# Patient Record
Sex: Male | Born: 1955 | Race: Black or African American | Hispanic: No | Marital: Single | Smoking: Former smoker
Health system: Southern US, Community
[De-identification: ages and names within clinical notes are randomized; demographics above are authoritative.]

## PROBLEM LIST (undated history)

## (undated) DIAGNOSIS — K219 Gastro-esophageal reflux disease without esophagitis: Secondary | ICD-10-CM

## (undated) DIAGNOSIS — N390 Urinary tract infection, site not specified: Secondary | ICD-10-CM

## (undated) DIAGNOSIS — R399 Unspecified symptoms and signs involving the genitourinary system: Secondary | ICD-10-CM

## (undated) DIAGNOSIS — S42401A Unspecified fracture of lower end of right humerus, initial encounter for closed fracture: Secondary | ICD-10-CM

## (undated) DIAGNOSIS — R7303 Prediabetes: Secondary | ICD-10-CM

## (undated) DIAGNOSIS — L309 Dermatitis, unspecified: Secondary | ICD-10-CM

## (undated) DIAGNOSIS — I10 Essential (primary) hypertension: Secondary | ICD-10-CM

## (undated) HISTORY — DX: Gastro-esophageal reflux disease without esophagitis: K21.9

## (undated) HISTORY — PX: POLYPECTOMY: SHX149

## (undated) HISTORY — DX: Unspecified symptoms and signs involving the genitourinary system: R39.9

## (undated) HISTORY — DX: Prediabetes: R73.03

## (undated) HISTORY — PX: KNEE ARTHROSCOPY W/ MENISCAL REPAIR: SHX1877

## (undated) HISTORY — DX: Dermatitis, unspecified: L30.9

## (undated) HISTORY — DX: Essential (primary) hypertension: I10

## (undated) HISTORY — DX: Urinary tract infection, site not specified: N39.0

## (undated) HISTORY — DX: Unspecified fracture of lower end of right humerus, initial encounter for closed fracture: S42.401A

---

## 2006-11-30 ENCOUNTER — Emergency Department (HOSPITAL_COMMUNITY): Admission: EM | Admit: 2006-11-30 | Discharge: 2006-11-30 | Payer: Self-pay | Admitting: Emergency Medicine

## 2007-01-15 ENCOUNTER — Ambulatory Visit: Payer: Self-pay | Admitting: Internal Medicine

## 2007-01-15 LAB — CONVERTED CEMR LAB
BUN: 11 mg/dL (ref 6–23)
Calcium: 9 mg/dL (ref 8.4–10.5)
Chloride: 104 meq/L (ref 96–112)
Creatinine, Ser: 1.5 mg/dL (ref 0.4–1.5)
Eosinophils Absolute: 0.4 10*3/uL (ref 0.0–0.6)
GFR calc Af Amer: 64 mL/min
GFR calc non Af Amer: 53 mL/min
Glucose, Bld: 84 mg/dL (ref 70–99)
Hemoglobin: 15.6 g/dL (ref 13.0–17.0)
Lymphocytes Relative: 43.7 % (ref 12.0–46.0)
MCHC: 34.2 g/dL (ref 30.0–36.0)
MCV: 88.7 fL (ref 78.0–100.0)
Neutrophils Relative %: 38.7 % — ABNORMAL LOW (ref 43.0–77.0)
PSA: 0.63 ng/mL (ref 0.10–4.00)
Potassium: 4 meq/L (ref 3.5–5.1)
RDW: 12.1 % (ref 11.5–14.6)
Sodium: 140 meq/L (ref 135–145)
WBC: 4.8 10*3/uL (ref 4.5–10.5)

## 2007-01-17 ENCOUNTER — Encounter: Payer: Self-pay | Admitting: Internal Medicine

## 2007-01-17 LAB — CONVERTED CEMR LAB
Bilirubin Urine: NEGATIVE
Ketones, ur: NEGATIVE mg/dL
Leukocytes, UA: NEGATIVE
RBC / HPF: NONE SEEN (ref <3)
Urine Glucose: NEGATIVE mg/dL
Urobilinogen, UA: 0.2 (ref 0.0–1.0)
WBC, UA: NONE SEEN cells/hpf (ref <3)

## 2007-03-21 ENCOUNTER — Ambulatory Visit: Payer: Self-pay | Admitting: Cardiology

## 2007-04-11 ENCOUNTER — Encounter: Payer: Self-pay | Admitting: Internal Medicine

## 2007-04-11 ENCOUNTER — Ambulatory Visit: Payer: Self-pay | Admitting: Internal Medicine

## 2007-04-11 LAB — CONVERTED CEMR LAB
Bilirubin Urine: NEGATIVE
Bilirubin Urine: NEGATIVE
Glucose, Urine, Semiquant: NEGATIVE
Ketones, urine, test strip: NEGATIVE
Nitrite: NEGATIVE
Protein, U semiquant: NEGATIVE
Protein, ur: NEGATIVE mg/dL
Specific Gravity, Urine: 1.015
Specific Gravity, Urine: 1.017 (ref 1.005–1.03)
Urine Glucose: NEGATIVE mg/dL
Urobilinogen, UA: NEGATIVE
WBC Urine, dipstick: NEGATIVE
WBC, UA: NONE SEEN cells/hpf (ref <3)
pH: 6 (ref 5.0–8.0)

## 2007-08-05 ENCOUNTER — Telehealth (INDEPENDENT_AMBULATORY_CARE_PROVIDER_SITE_OTHER): Payer: Self-pay | Admitting: *Deleted

## 2007-08-05 ENCOUNTER — Encounter (INDEPENDENT_AMBULATORY_CARE_PROVIDER_SITE_OTHER): Payer: Self-pay | Admitting: *Deleted

## 2007-12-31 ENCOUNTER — Encounter: Payer: Self-pay | Admitting: Internal Medicine

## 2008-12-24 ENCOUNTER — Ambulatory Visit: Payer: Self-pay | Admitting: Internal Medicine

## 2008-12-24 ENCOUNTER — Encounter (INDEPENDENT_AMBULATORY_CARE_PROVIDER_SITE_OTHER): Payer: Self-pay | Admitting: *Deleted

## 2008-12-27 LAB — CONVERTED CEMR LAB
ALT: 60 units/L — ABNORMAL HIGH (ref 0–53)
AST: 24 units/L (ref 0–37)
Cholesterol: 178 mg/dL (ref 0–200)
Eosinophils Absolute: 0.3 10*3/uL (ref 0.0–0.7)
Eosinophils Relative: 6 % — ABNORMAL HIGH (ref 0–5)
LDL Cholesterol: 99 mg/dL (ref 0–99)
Lymphs Abs: 2.4 10*3/uL (ref 0.7–4.0)
MCHC: 33.6 g/dL (ref 30.0–36.0)
Monocytes Absolute: 0.4 10*3/uL (ref 0.1–1.0)
Monocytes Relative: 7 % (ref 3–12)
Neutro Abs: 2.2 10*3/uL (ref 1.7–7.7)
Neutrophils Relative %: 41 % — ABNORMAL LOW (ref 43–77)
PSA: 0.67 ng/mL (ref 0.10–4.00)
Platelets: 287 10*3/uL (ref 150–400)
Potassium: 4.2 meq/L (ref 3.5–5.3)
Sodium: 140 meq/L (ref 135–145)
TSH: 0.953 microintl units/mL (ref 0.350–4.50)
Triglycerides: 95 mg/dL (ref <150)
WBC: 5.4 10*3/uL (ref 4.0–10.5)

## 2008-12-28 ENCOUNTER — Ambulatory Visit: Payer: Self-pay | Admitting: Internal Medicine

## 2009-01-03 ENCOUNTER — Encounter (INDEPENDENT_AMBULATORY_CARE_PROVIDER_SITE_OTHER): Payer: Self-pay | Admitting: *Deleted

## 2009-03-15 ENCOUNTER — Ambulatory Visit: Payer: Self-pay | Admitting: Internal Medicine

## 2009-03-15 ENCOUNTER — Encounter (INDEPENDENT_AMBULATORY_CARE_PROVIDER_SITE_OTHER): Payer: Self-pay | Admitting: *Deleted

## 2009-03-15 DIAGNOSIS — I10 Essential (primary) hypertension: Secondary | ICD-10-CM | POA: Insufficient documentation

## 2009-03-15 HISTORY — DX: Essential (primary) hypertension: I10

## 2009-03-29 ENCOUNTER — Ambulatory Visit: Payer: Self-pay | Admitting: Internal Medicine

## 2009-03-30 ENCOUNTER — Telehealth: Payer: Self-pay | Admitting: Internal Medicine

## 2009-04-22 ENCOUNTER — Encounter: Payer: Self-pay | Admitting: Internal Medicine

## 2009-06-06 ENCOUNTER — Ambulatory Visit: Payer: Self-pay | Admitting: Internal Medicine

## 2009-10-05 ENCOUNTER — Encounter (INDEPENDENT_AMBULATORY_CARE_PROVIDER_SITE_OTHER): Payer: Self-pay | Admitting: *Deleted

## 2010-01-07 ENCOUNTER — Emergency Department (HOSPITAL_COMMUNITY): Admission: EM | Admit: 2010-01-07 | Discharge: 2010-01-07 | Payer: Self-pay | Admitting: Family Medicine

## 2010-01-09 ENCOUNTER — Telehealth: Payer: Self-pay | Admitting: Internal Medicine

## 2010-02-10 ENCOUNTER — Ambulatory Visit: Payer: Self-pay | Admitting: Internal Medicine

## 2010-02-13 LAB — CONVERTED CEMR LAB
ALT: 44 units/L (ref 0–53)
AST: 23 units/L (ref 0–37)
Albumin: 4.2 g/dL (ref 3.5–5.2)
Alkaline Phosphatase: 49 units/L (ref 39–117)
BUN: 9 mg/dL (ref 6–23)
Basophils Absolute: 0 10*3/uL (ref 0.0–0.1)
Basophils Relative: 0.5 % (ref 0.0–3.0)
Bilirubin, Direct: 0.1 mg/dL (ref 0.0–0.3)
CO2: 30 meq/L (ref 19–32)
Calcium: 9.5 mg/dL (ref 8.4–10.5)
Chloride: 98 meq/L (ref 96–112)
Cholesterol: 186 mg/dL (ref 0–200)
Creatinine, Ser: 1.4 mg/dL (ref 0.4–1.5)
Eosinophils Absolute: 0.3 10*3/uL (ref 0.0–0.7)
Eosinophils Relative: 7 % — ABNORMAL HIGH (ref 0.0–5.0)
GFR calc non Af Amer: 68.19 mL/min (ref >60)
Glucose, Bld: 92 mg/dL (ref 70–99)
HCT: 46.8 % (ref 39.0–52.0)
HDL: 71.2 mg/dL (ref >39.00)
Hemoglobin: 16 g/dL (ref 13.0–17.0)
LDL Cholesterol: 104 mg/dL — ABNORMAL HIGH (ref 0–99)
Lymphocytes Relative: 39.3 % (ref 12.0–46.0)
Lymphs Abs: 1.9 10*3/uL (ref 0.7–4.0)
MCHC: 34.3 g/dL (ref 30.0–36.0)
MCV: 86.7 fL (ref 78.0–100.0)
Monocytes Absolute: 0.4 10*3/uL (ref 0.1–1.0)
Monocytes Relative: 8.6 % (ref 3.0–12.0)
Neutro Abs: 2.2 10*3/uL (ref 1.4–7.7)
Neutrophils Relative %: 44.6 % (ref 43.0–77.0)
PSA: 0.94 ng/mL (ref 0.10–4.00)
Platelets: 303 10*3/uL (ref 150.0–400.0)
Potassium: 4 meq/L (ref 3.5–5.1)
RBC: 5.39 M/uL (ref 4.22–5.81)
RDW: 13.8 % (ref 11.5–14.6)
Sodium: 133 meq/L — ABNORMAL LOW (ref 135–145)
TSH: 0.6 microintl units/mL (ref 0.35–5.50)
Total Bilirubin: 0.6 mg/dL (ref 0.3–1.2)
Total CHOL/HDL Ratio: 3
Total Protein: 7.6 g/dL (ref 6.0–8.3)
Triglycerides: 56 mg/dL (ref 0.0–149.0)
VLDL: 11.2 mg/dL (ref 0.0–40.0)
WBC: 4.9 10*3/uL (ref 4.5–10.5)

## 2010-02-14 ENCOUNTER — Encounter (INDEPENDENT_AMBULATORY_CARE_PROVIDER_SITE_OTHER): Payer: Self-pay | Admitting: *Deleted

## 2010-02-14 ENCOUNTER — Encounter: Payer: Self-pay | Admitting: Internal Medicine

## 2010-03-13 ENCOUNTER — Ambulatory Visit: Payer: Self-pay | Admitting: Internal Medicine

## 2010-03-14 ENCOUNTER — Encounter (INDEPENDENT_AMBULATORY_CARE_PROVIDER_SITE_OTHER): Payer: Self-pay | Admitting: *Deleted

## 2010-10-09 ENCOUNTER — Ambulatory Visit: Payer: Self-pay | Admitting: Internal Medicine

## 2010-10-09 DIAGNOSIS — L42 Pityriasis rosea: Secondary | ICD-10-CM

## 2010-12-03 ENCOUNTER — Encounter: Payer: Self-pay | Admitting: Internal Medicine

## 2010-12-04 ENCOUNTER — Encounter: Payer: Self-pay | Admitting: Internal Medicine

## 2010-12-12 NOTE — Assessment & Plan Note (Signed)
Summary: CPX/NS/KDC AND FASTING LABS///SPH   Vital Signs:  Patient profile:   55 year old male Height:      68.75 inches Weight:      168.25 pounds BMI:     25.12 Pulse rate:   82 / minute BP sitting:   110 / 72  Vitals Entered By: Kandice Hams (February 10, 2010 12:40 PM) CC: cpx   History of Present Illness: CPX feels well  Allergies: No Known Drug Allergies  Past History:  Past Medical History: UTI 2008, saw urology; patient reports a cystoscopy (told ok) R elbom Fx, no surgery 2-11  Past Surgical History: rt knee torn meniscus  Family History: breast Ca - M HTN - M DM - M CAD -- no stroke - brother colon Ca - no prostate Ca - uncle  Social History: Married - wife is June Groeneveld 1 child  from previous marriage  works @ [employer]-- ok tobacco-- quit 2008 ETOH-- no exercise-- active, also exercise routinely   Review of Systems General:  Denies fatigue, fever, and weight loss. CV:  Denies chest pain or discomfort and swelling of feet. Resp:  Denies cough and shortness of breath. GI:  Denies bloody stools, nausea, and vomiting. GU:  Denies dysuria, hematuria, and urinary hesitancy. Derm:  rash betwen buttocks , was seen by derm before , was Rx a cream that helped  "same type of rash", ++ pruritus  also rash in the hand x few days , ++ pruritus.  Physical Exam  General:  alert, well-developed, and well-nourished.   Neck:  no masses, no thyromegaly, and no cervical lymphadenopathy.   Lungs:  normal respiratory effort, no intercostal retractions, no accessory muscle use, and normal breath sounds.   Heart:  normal rate, regular rhythm, no murmur, and no gallop.   Abdomen:  soft, non-tender, no distention, no masses, no guarding, and no rigidity.   Rectal:  No external abnormalities noted. Normal sphincter tone. No rectal masses or tenderness. brown stools, Hemoccult negative Prostate:  Prostate gland firm and smooth, no enlargement,  nodularity, tenderness, mass, asymmetry or induration. Skin:  between the buttocks   has a area slightly macerated skin, hypochromic.  On palpation, there is no mass or induration  right hand, between the first and second finger has a 1 cm area off rough, hyperchromic skin   Impression & Recommendations:  Problem # 1:  HEALTH SCREENING (ICD-V70.0)  Td 2008  never had a cscope  Colonoscopy Vs.iFOB cards reviewed w/ pt. Provided  iFOB but he will  call if he decides to have a  colonoscopy  praised  for his healthy lifestyle labs    Orders: Venipuncture (59563) TLB-BMP (Basic Metabolic Panel-BMET) (80048-METABOL) TLB-Hepatic/Liver Function Pnl (80076-HEPATIC) TLB-Lipid Panel (80061-LIPID) TLB-PSA (Prostate Specific Antigen) (84153-PSA) TLB-TSH (Thyroid Stimulating Hormone) (84443-TSH) TLB-CBC Platelet - w/Differential (85025-CBCD)  Problem # 2:  DERMATITIS (ICD-692.9)  dermatitis between the buttocks: Intertrigo?   hand dermatitis: Fungal?  see instructions   His updated medication list for this problem includes:    Clobetasol Propionate 0.05 % Oint (Clobetasol propionate) .Marland Kitchen... Apply twice a day  Complete Medication List: 1)  Amlodipine Besylate 5 Mg Tabs (Amlodipine besylate) .Marland Kitchen.. 1 a day 2)  Clobetasol Propionate 0.05 % Oint (Clobetasol propionate) .... Apply twice a day 3)  Ketoconazole 2 % Crea (Ketoconazole) .... Apply twice a day  Patient Instructions: 1)  mix  clobetasol and ketoconazole 50/50 and applied to the hand and buttocks twice a day  for 10 days.  If not better let me know 2)  Please schedule a follow-up appointment in 1 year.  Prescriptions: AMLODIPINE BESYLATE 5 MG TABS (AMLODIPINE BESYLATE) 1 a day  #90 x 4   Entered and Authorized by:   Elita Quick E. Lovelace Cerveny MD   Signed by:   Nolon Rod. Pearlie Nies MD on 02/10/2010   Method used:   Print then Give to Patient   RxID:   [id number] KETOCONAZOLE 2 % CREA (KETOCONAZOLE) apply twice a day  #1 x 0   Entered and Authorized  by:   Nolon Rod. Jaslyne Beeck MD   Signed by:   Nolon Rod. Kyrian Stage MD on 02/10/2010   Method used:   Print then Give to Patient   RxID:   [id number] CLOBETASOL PROPIONATE 0.05 % OINT (CLOBETASOL PROPIONATE) apply twice a day  #1 x 0   Entered and Authorized by:   Nolon Rod. Pastor Sgro MD   Signed by:   Nolon Rod. Tierra Thoma MD on 02/10/2010   Method used:   Print then Give to Patient   RxID:   [id number]

## 2010-12-12 NOTE — Letter (Signed)
Summary: Generic Letter  [facility] [place]  [address] [place], [place] [address]   Phone: [phone]  Fax: [phone]    03/14/2010  THOMAS MABRY [address] [place], [place]  [address]  Dear Mr. LATORRE,   Please return the stool kit that was provided to you at your last office visit.  You can return it in the evelope that was provided with the kit.  Please call our office with any concerns.   Sincerely,   Shary Decamp

## 2010-12-12 NOTE — Assessment & Plan Note (Signed)
Summary: rash all over/kn   Vital Signs:  Patient profile:   55 year old male Weight:      167.13 pounds Temp:     97.9 degrees F oral Pulse rate:   86 / minute Pulse rhythm:   regular BP sitting:   132 / 76  (left arm) Cuff size:   large  Vitals Entered By: Army Fossa CMA (October 09, 2010 11:54 AM) CC: Pt here c/o rash on chest, stomach, and back. Comments States it itches x 1 week  [facility] [address]   History of Present Illness: rash for 10 days In the chest, abdomen and back. Very mild in the legs. Not affecting the face. causing some itching some lesions started as blisters?  Review of systems No fever or arthralgias. In general feels very well except for the rash  Current Medications (verified): 1)  Amlodipine Besylate 5 Mg Tabs (Amlodipine Besylate) .Marland Kitchen.. 1 A Day 2)  Clobetasol Propionate 0.05 % Oint (Clobetasol Propionate) .... Apply Twice A Day 3)  Ketoconazole 2 % Crea (Ketoconazole) .... Apply Twice A Day  Allergies (verified): No Known Drug Allergies  Past History:  Past Medical History: Reviewed history from 02/10/2010 and no changes required. UTI 2008, saw urology; patient reports a cystoscopy (told ok) R elbom Fx, no surgery 2-11  Past Surgical History: Reviewed history from 02/10/2010 and no changes required. rt knee torn meniscus  Physical Exam  General:  alert, well-developed, and well-nourished.   Skin:  multiple macular lesions (abdomen>back=chest>> leg), largest lesion in  size no more than 1 cm, skin is  slightly scaly and hyperchromic, borders slightly elevated. Several lesions in the back in a classic Christmas tree distribution. 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 despite the fact that some of the lesions started as blisters (no blisters seen today, lesions are quite consistent w/ P.R.) extensive discussion about the dx showed him  pictures likes "something done" per UTD some experts recommend acyclovir. Rx provided see instructions   Complete Medication List: 1)  Amlodipine Besylate 5 Mg Tabs (Amlodipine besylate) .Marland Kitchen.. 1 a day 2)  Clobetasol Propionate 0.05 % Oint (Clobetasol propionate) .... Apply twice a day 3)  Ketoconazole 2 % Crea (Ketoconazole) .... Apply twice a day 4)  Acyclovir 400 Mg Tabs (Acyclovir) .Marland Kitchen.. 1 by mouth 5 times a day x 1 week  Patient Instructions: 1)  benadryl at night 2)  rash is : pytiriasis rosea  Prescriptions: ACYCLOVIR 400 MG TABS (ACYCLOVIR) 1 by mouth 5 times a day x 1 week  #35 x 0   Entered and Authorized by:   Nolon Rod. Paz MD   Signed by:   Nolon Rod. Paz MD on 10/09/2010   Method used:   Print then Give to Patient   RxID:   [id number]    Orders Added: 1)  Est. Patient Level III [56213]

## 2010-12-12 NOTE — Letter (Signed)
Summary: [facility]: Immunoassay Fecal Occult Blood (iFOB) Order Form  [facility] [place]  [address] [place], [place] [address]   Phone: [phone]  Fax: [phone]      [facility]: Immunoassay Fecal Occult Blood (iFOB) Order Form   February 14, 2010 MRN: [id number]   Steven Wood 1956/03/21   Physicican Name:_________paz________________  Diagnosis Code:__________v76.51________________      Steven Wood

## 2010-12-12 NOTE — Letter (Signed)
Summary: Annual Physical Form/[facility]  Annual Physical Form/[facility]   Imported By: Lanelle Bal 02/17/2010 11:59:41  _____________________________________________________________________  External Attachment:    Type:   Image     Comment:   External Document

## 2010-12-12 NOTE — Progress Notes (Signed)
Summary: FYI  Phone Note From Other Clinic    Follow-up for Phone Call        Reason for Call: Wife/June callinng that pt injured R arm 12/31/09 and now has continued swelling from elbow down to hand. Pt fell on arm. triaged elbow injury and inst to go to the E/R for eval. Protocol(s) Used: Elbow Injury Recommended Outcome per Protocol: See ED Immediately Reason for Outcome: Unbearable pain Care Advice:  ~ Another adult should drive.  ~ Do not give the patient anything to eat or drink.  ~ Remove any rings on the fingers of the affected hand, if possible.  ~ Apply cloth-covered ice pack or a cool compress to the area while in transit to reduce pain and swelling.  ~ Support injured part in position of comfort to reduce pain and prevent further damage; avoid unnecessary movement. 02/  Additional Follow-up for Phone Call Additional follow up Details #1::        left msg for pt to call .Kandice Hams  January 09, 2010 8:39 AM --Pt called back [facility]  made appt for  Ortho Surgeon friday 01/13/10 at 10AM for assesment. --Pt has appt for his cpx 02/10/10  Additional Follow-up by: Kandice Hams,  January 09, 2010 10:18 AM

## 2010-12-25 IMAGING — CR DG ELBOW COMPLETE 3+V*R*
4 series · 4 of 4 positions shown · non-contrast
Comparison: None.

CLINICAL DATA: Elbow injury.  Pain and swelling.

RIGHT ELBOW - COMPLETE 3+ VIEW

[view not recorded (1 of 4)]
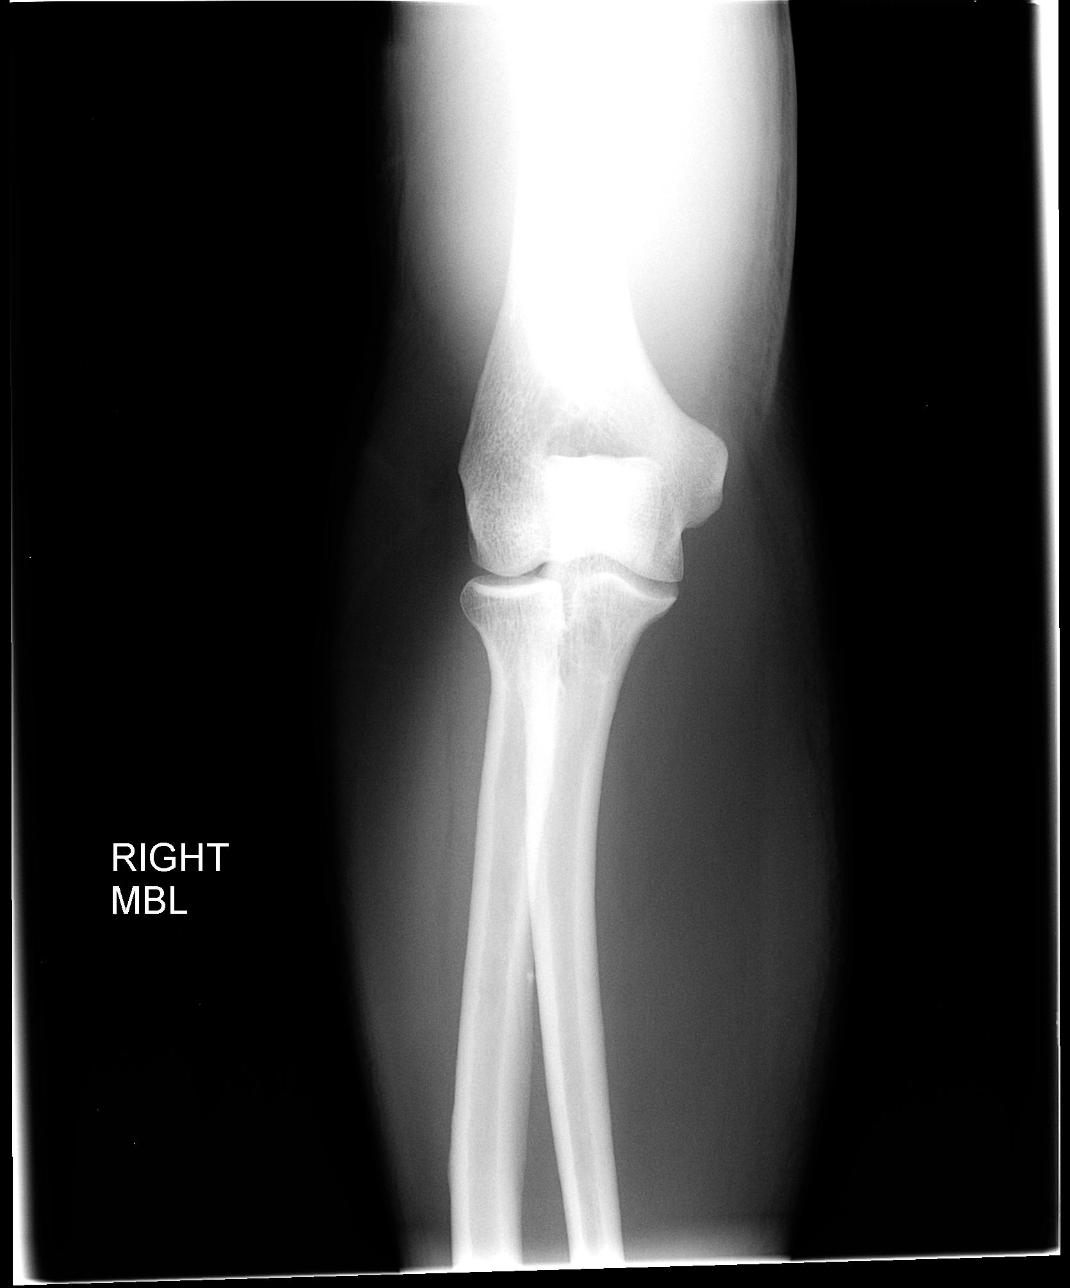

[view not recorded (2 of 4)]
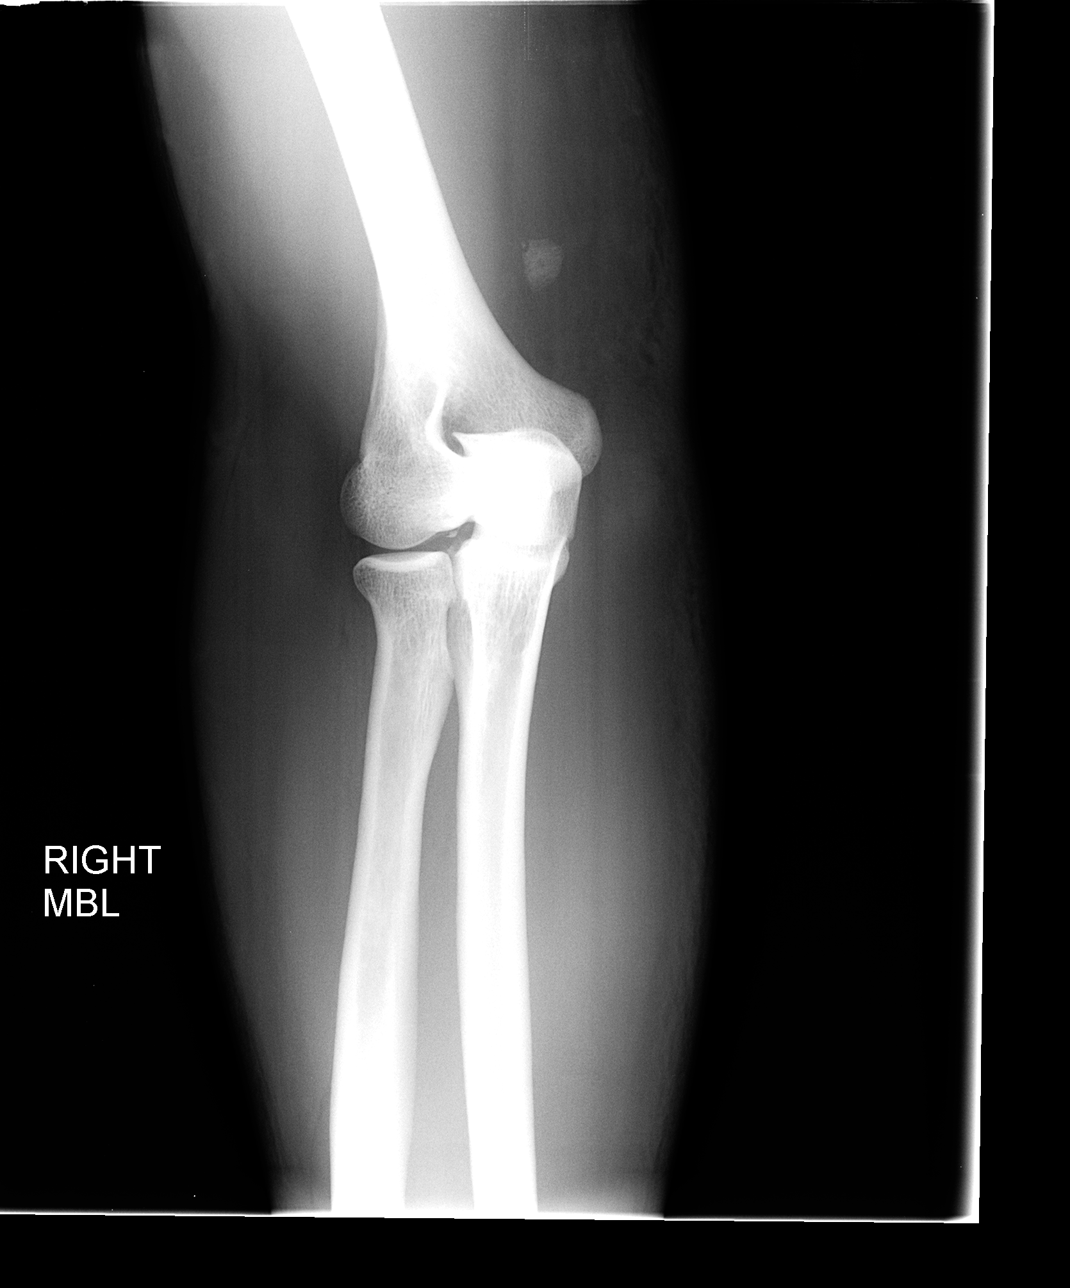

[view not recorded (3 of 4)]
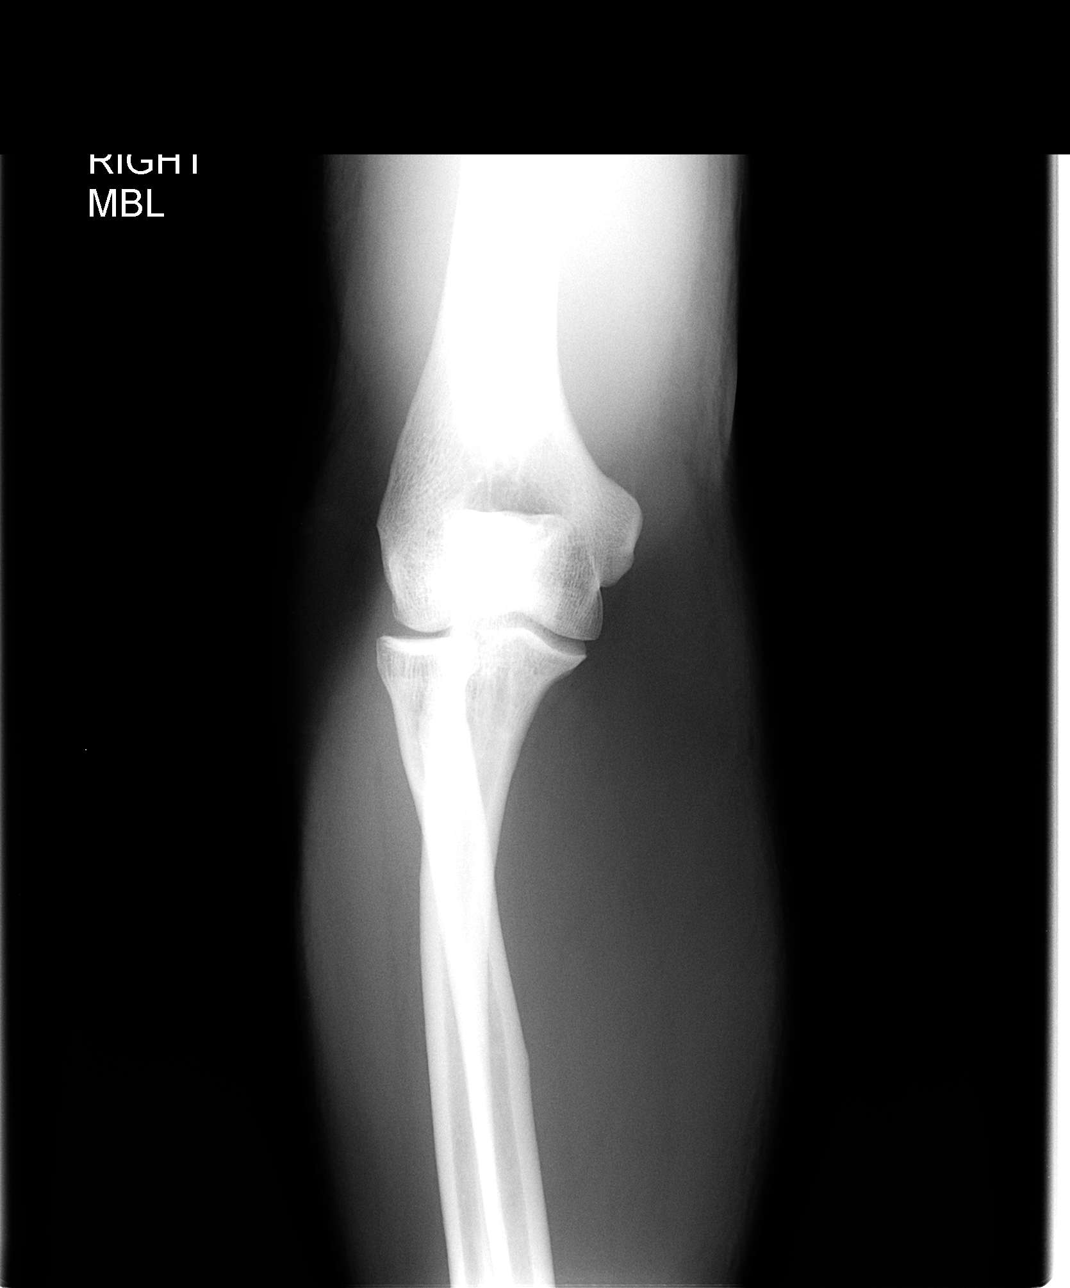

[view not recorded (4 of 4)]
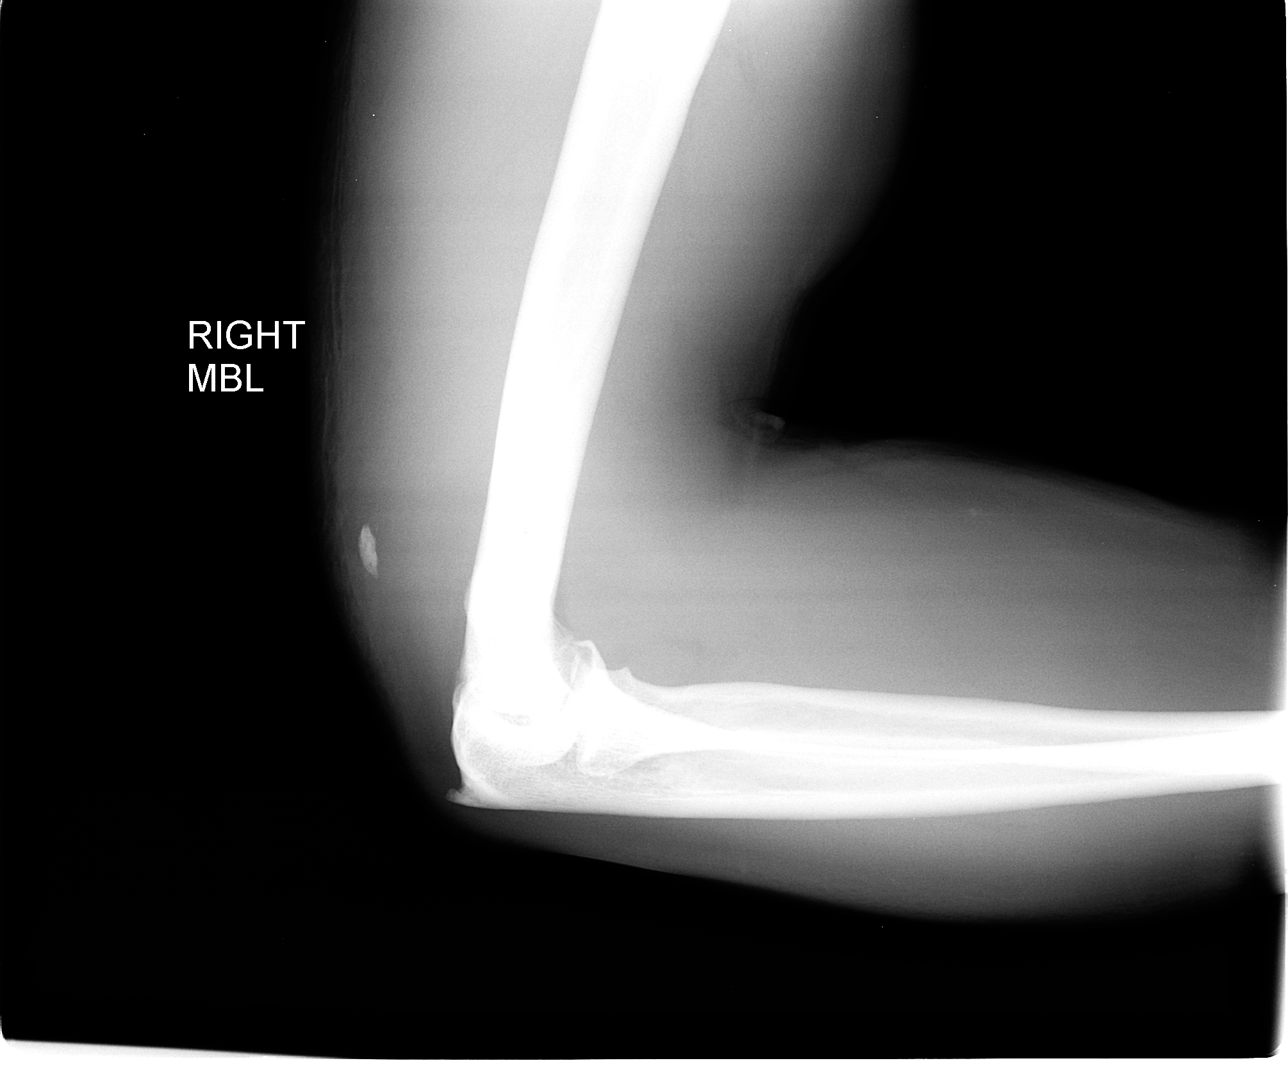

[4 of 4 positions shown; findings below may reference images not displayed]

FINDINGS: No evidence of acute fracture or dislocation.  No
evidence of elbow joint effusion.

Mild degenerative spurring of the elbow joint is seen.  Small
olecranon bone spur noted.  Heterotopic ossification is seen in the
dorsal soft tissues adjacent to the distal humerus.
IMPRESSION: No acute findings.

## 2010-12-25 IMAGING — CR DG HUMERUS 2V *R*
3 series · 3 of 3 positions shown · non-contrast
Comparison: None.

CLINICAL DATA: Fall.  Arm injury and pain.

RIGHT HUMERUS - 2+ VIEW

[view not recorded (1 of 3)]
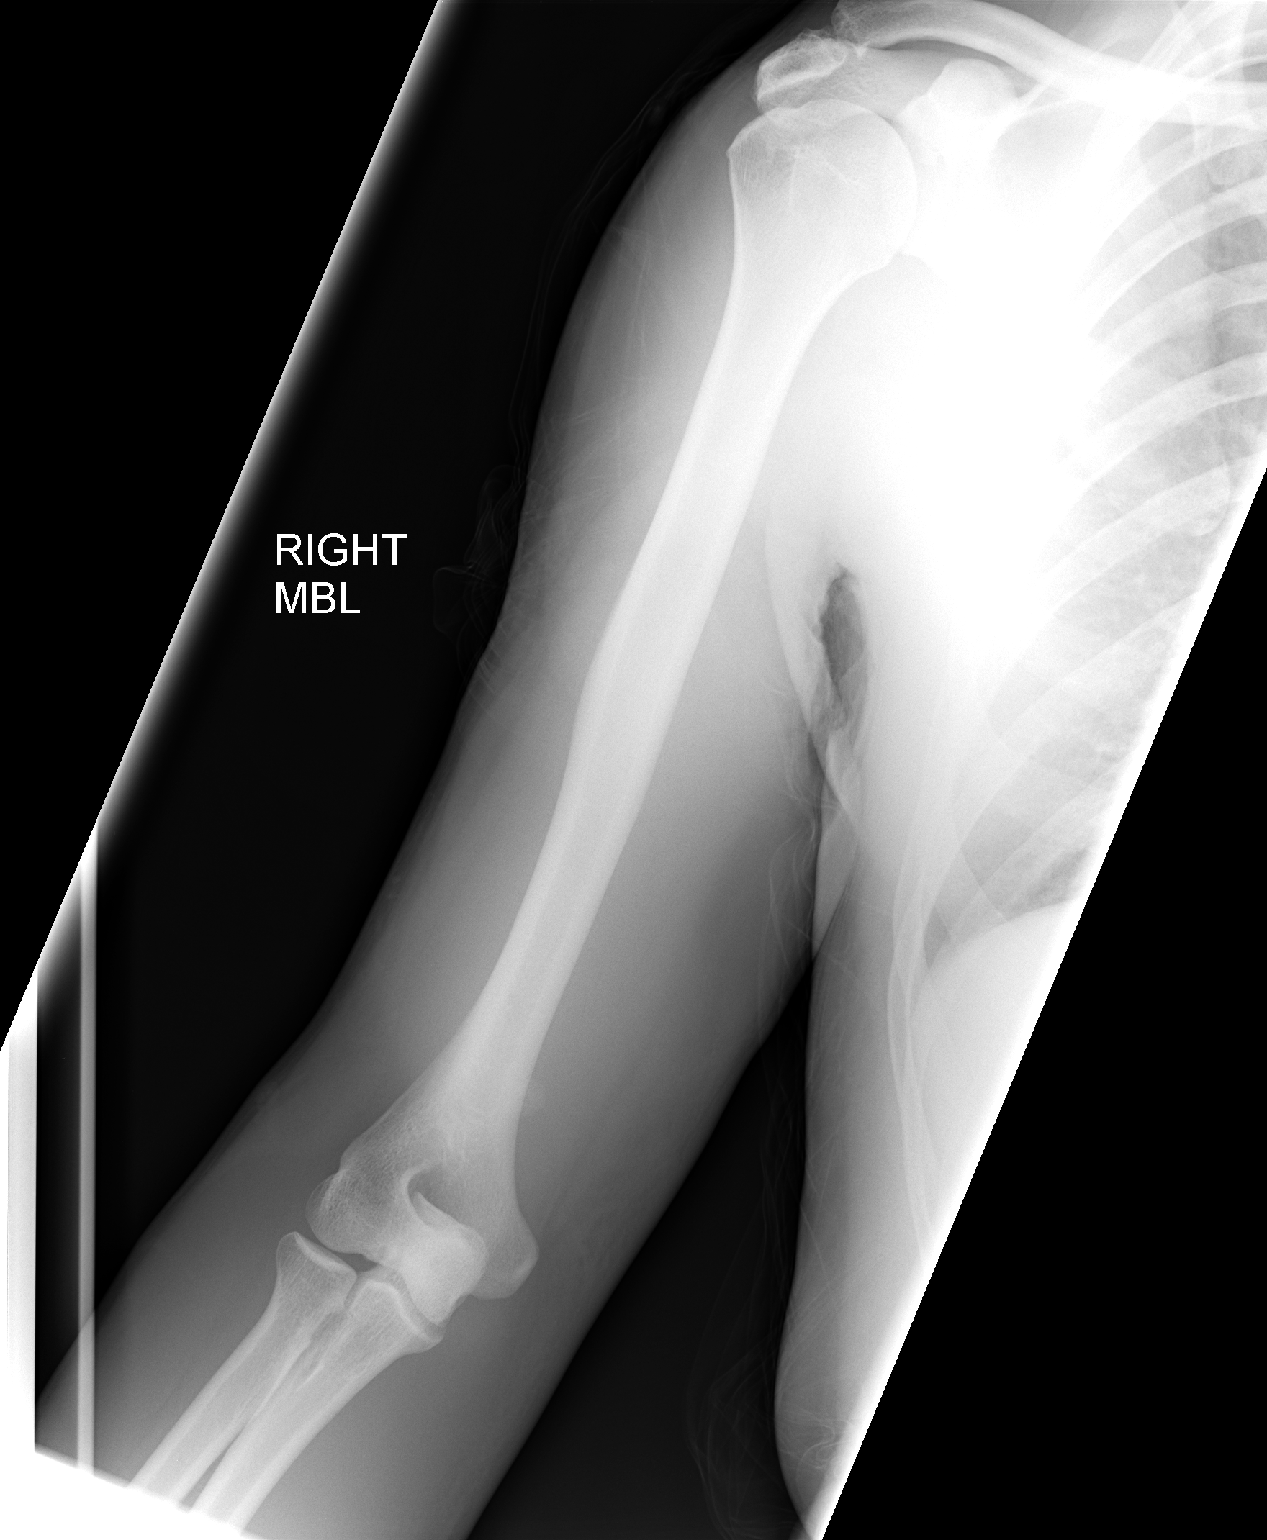

[view not recorded (2 of 3)]
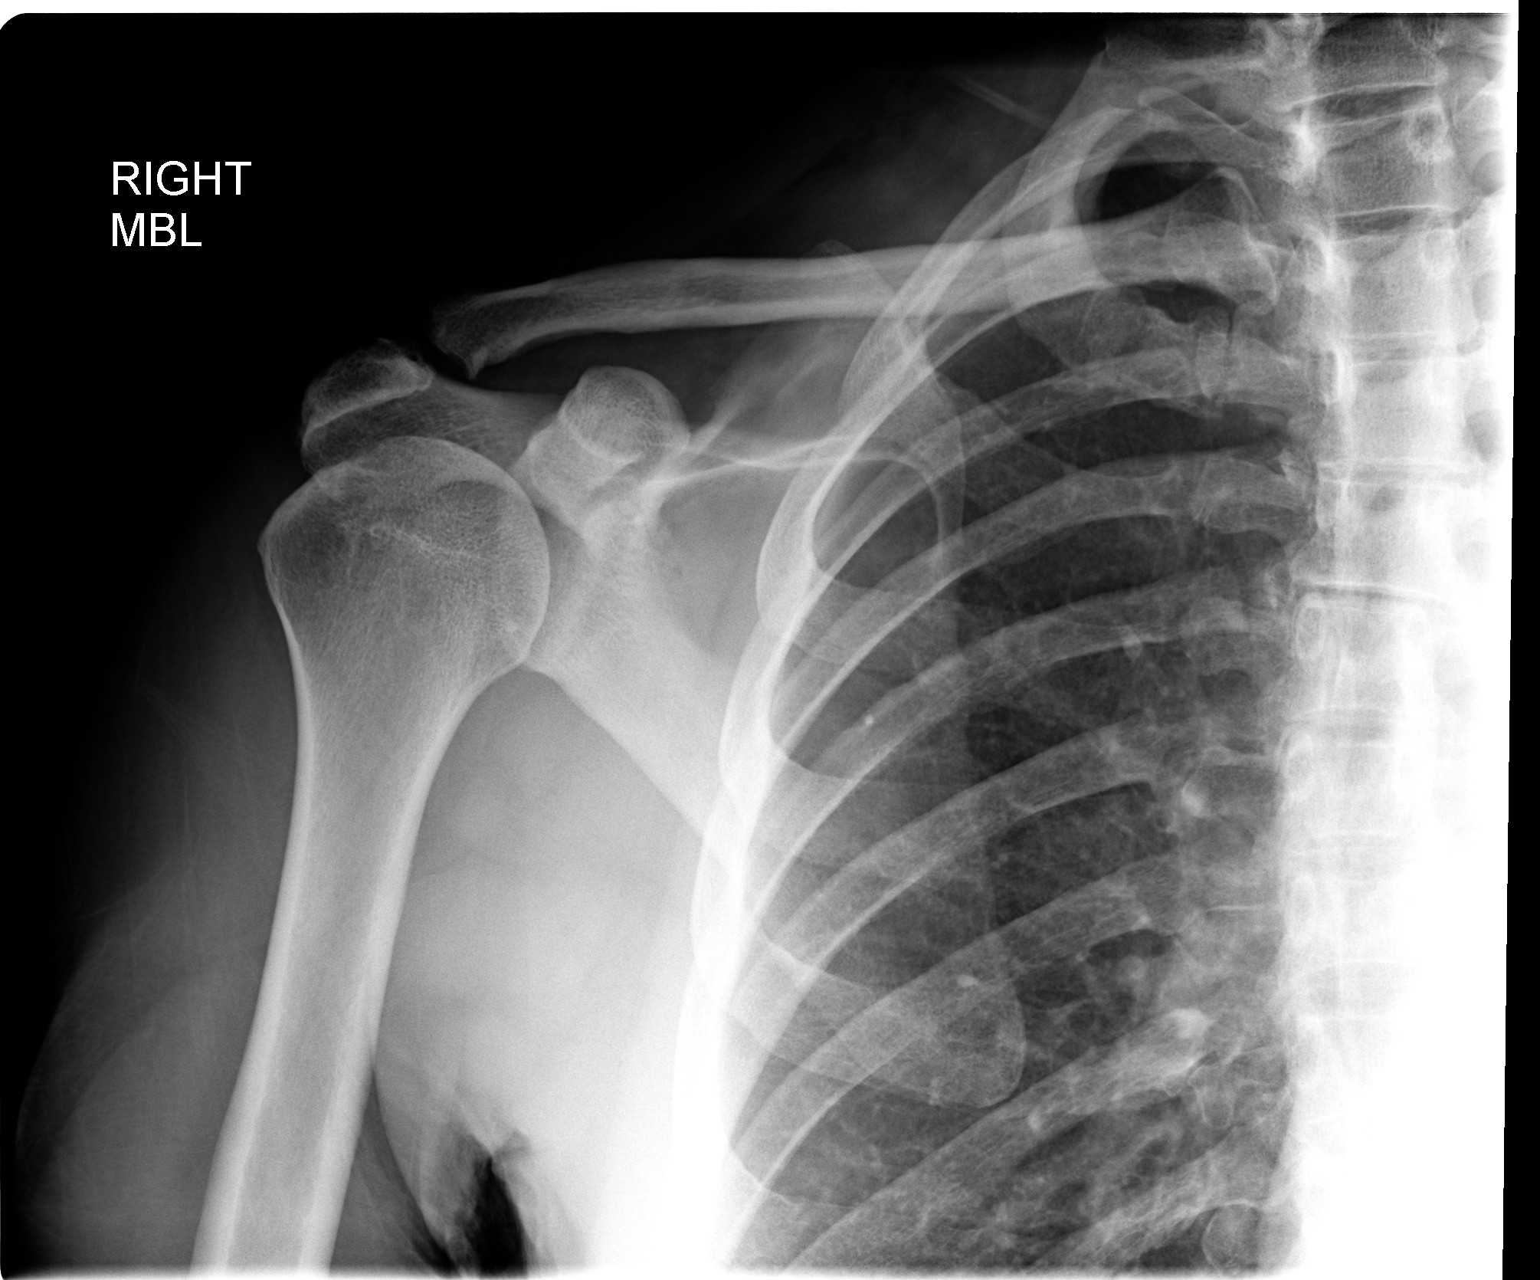

[view not recorded (3 of 3)]
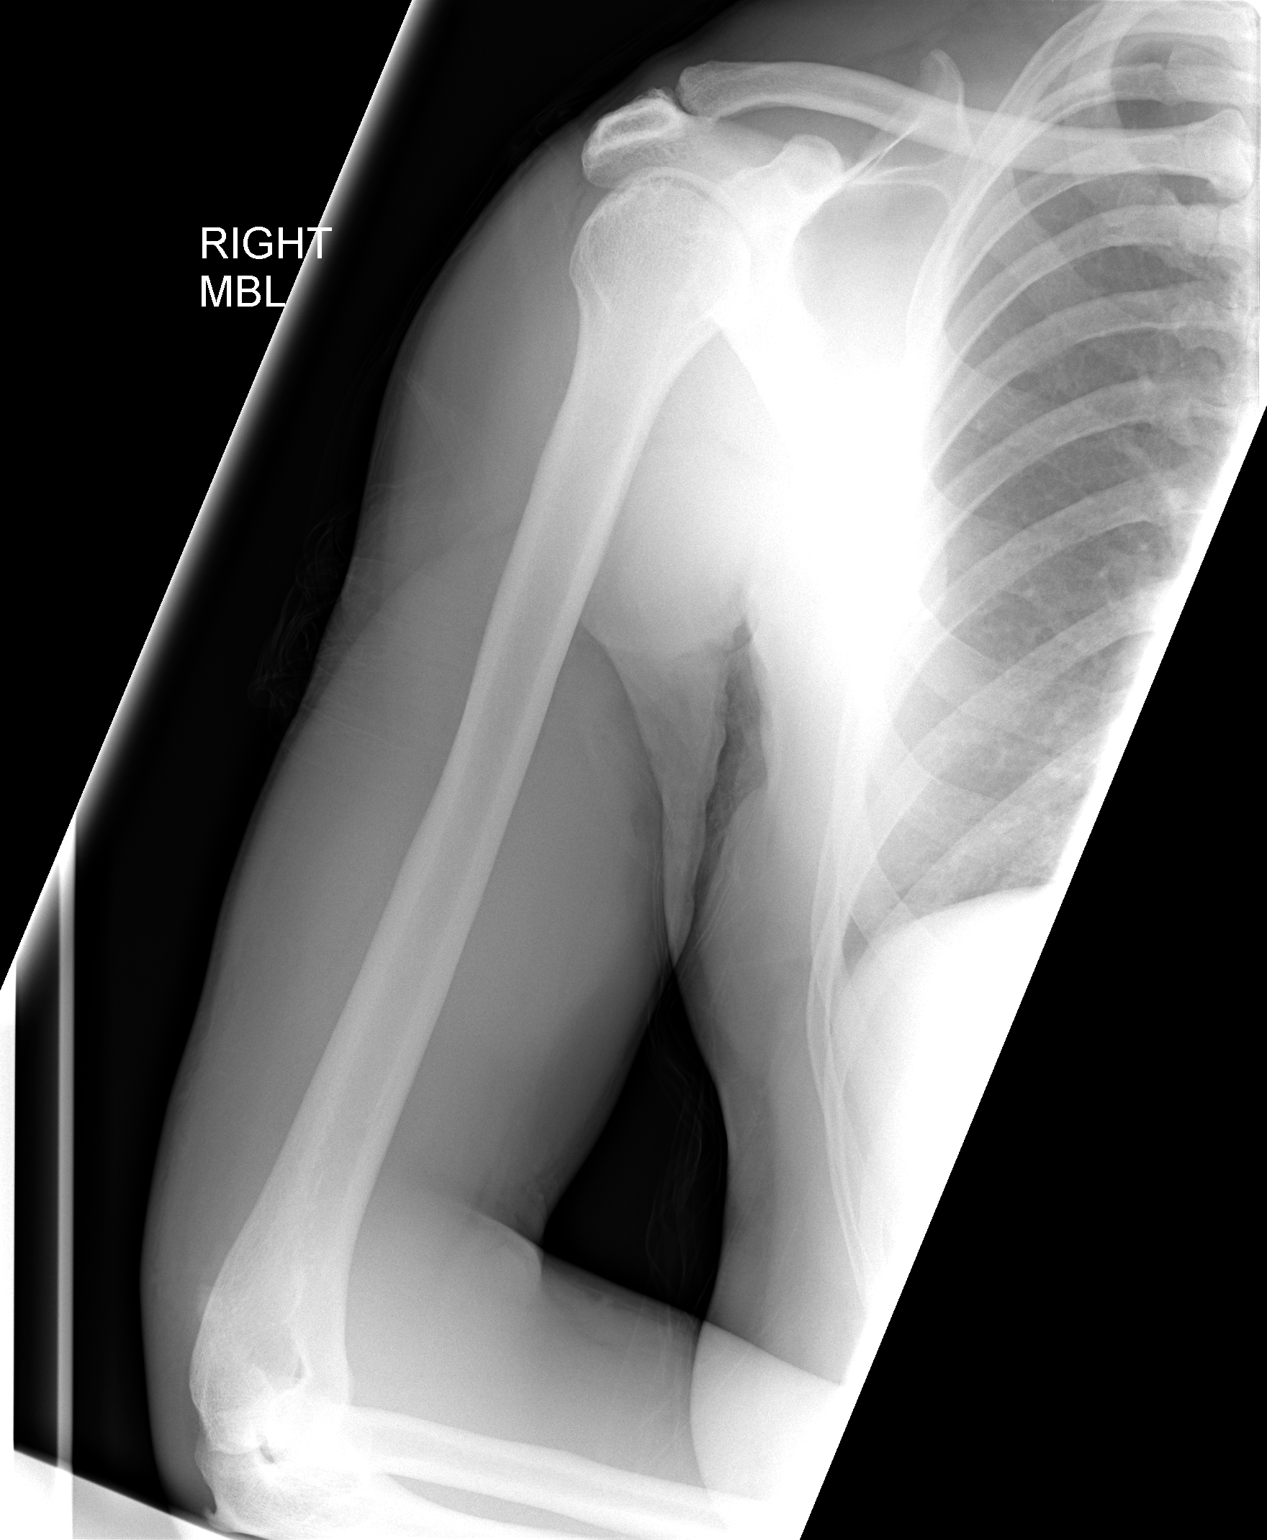

[3 of 3 positions shown; findings below may reference images not displayed]

FINDINGS: No evidence of fracture or other significant bone
abnormality.  Degenerative spurring is seen involving the
acromioclavicular joint.  Lateral downsloping of the acromion also
noted, which predisposes to shoulder impingement.
IMPRESSION: No acute findings.

## 2011-02-23 ENCOUNTER — Other Ambulatory Visit: Payer: Self-pay | Admitting: Internal Medicine

## 2011-06-02 ENCOUNTER — Other Ambulatory Visit: Payer: Self-pay | Admitting: Internal Medicine

## 2011-06-04 ENCOUNTER — Telehealth: Payer: Self-pay | Admitting: *Deleted

## 2011-06-05 ENCOUNTER — Encounter: Payer: Self-pay | Admitting: Internal Medicine

## 2011-06-05 NOTE — Telephone Encounter (Signed)
Patient has appt for cpx [id number] - can refill be sent to pharmacy - he is aware he needs to keep appt

## 2011-06-05 NOTE — Telephone Encounter (Signed)
Med was sent in

## 2011-07-03 ENCOUNTER — Ambulatory Visit (INDEPENDENT_AMBULATORY_CARE_PROVIDER_SITE_OTHER): Payer: BC Managed Care – PPO | Admitting: Internal Medicine

## 2011-07-03 ENCOUNTER — Telehealth: Payer: Self-pay | Admitting: *Deleted

## 2011-07-03 ENCOUNTER — Encounter: Payer: Self-pay | Admitting: Internal Medicine

## 2011-07-03 ENCOUNTER — Encounter: Payer: Self-pay | Admitting: *Deleted

## 2011-07-03 DIAGNOSIS — Z0000 Encounter for general adult medical examination without abnormal findings: Secondary | ICD-10-CM | POA: Insufficient documentation

## 2011-07-03 DIAGNOSIS — I10 Essential (primary) hypertension: Secondary | ICD-10-CM

## 2011-07-03 MED ORDER — AMLODIPINE BESYLATE 5 MG PO TABS: 5.0000 mg | ORAL_TABLET | Freq: Every day | ORAL | Status: DC

## 2011-07-03 NOTE — Assessment & Plan Note (Signed)
Controlled, no change 

## 2011-07-03 NOTE — Telephone Encounter (Signed)
Same Day Abstraction. 

## 2011-07-03 NOTE — Assessment & Plan Note (Addendum)
Td 2008  never had a cscope  Colonoscopy Vs.iFOB cards reviewed w/ pt. Provided  iFOB but he will  call if he decides to have a  colonoscopy  praised  for his healthy lifestyle labs Check a PSA, DRE essentially normal, slightly enlarged left prostate side?. If PSA normal would recheck yearly as usual

## 2011-07-03 NOTE — Progress Notes (Signed)
  Subjective:    Patient ID: Steven Wood, male    DOB: 1956/06/22, 55 y.o.   MRN: [id number]  HPI CPX , dong well  Past Medical History  Diagnosis Date  . UTI (urinary tract infection)     UTI 2008, saw urology; patient reports a cystoscopy (told ok)  . Elbow fracture, right     no surgery  . HYPERTENSION, BORDERLINE 03/15/2009   Past Surgical History  Procedure Date  . Knee arthroscopy w/ meniscal repair remotely    Right   Family History  Problem Relation Age of Onset  . Breast cancer      M  . Hypertension      M  . Diabetes      M, Brother   . Stroke      Brother   . Colon cancer Neg Hx   . Prostate cancer      Uncle   History   Social History  . Marital Status: Single    Spouse Name: N/A    Number of Children: N/A  . Years of Education: N/A   Occupational History  . deliver , truck driver    Social History Main Topics  . Smoking status: Former Smoker    Quit date: 11/12/2006  . Smokeless tobacco: Not on file  . Alcohol Use: No  . Drug Use: No  . Sexually Active: Not on file   Other Topics Concern  . Not on file   Social History Narrative   Married - wife is Steven Wood---1 child from previous marriage---Diet:healthy----Regular Exercise: active, also exercise routinely---      Review of Systems No chest pain or shortness of breath No nausea, vomiting, diarrhea or blood in the stools. No dysuria or gross hematuria. His blood pressures checked from time to time at a pharmacy and is usually within normal. Having some stress, his 68 year old mother most reason for her to a nursing home, she has a number of medical issues. On to the hand he changed jobs and doing better in the new position.    Objective:   Physical Exam  Constitutional: He is oriented to person, place, and time. He appears well-developed and well-nourished.  HENT:  Head: Normocephalic and atraumatic.  Neck: No thyromegaly present.       Normal carotid pulses  Cardiovascular:  Normal rate, regular rhythm and normal heart sounds.   No murmur heard. Pulmonary/Chest: Effort normal and breath sounds normal. No respiratory distress. He has no wheezes. He has no rales.  Abdominal: Bowel sounds are normal. He exhibits no distension. There is no tenderness. There is no rebound and no guarding.  Genitourinary: Rectum normal. Guaiac negative stool.       Prostate firm but not indurated, slightly larger on the left?Marland Kitchen No nodular, no tenderness  Musculoskeletal: He exhibits no edema.  Neurological: He is alert and oriented to person, place, and time.  Skin: He is not diaphoretic.  Psychiatric: He has a normal mood and affect. His behavior is normal. Judgment and thought content normal.          Assessment & Plan:

## 2011-07-04 LAB — CBC WITH DIFFERENTIAL/PLATELET
Basophils Absolute: 0 10*3/uL (ref 0.0–0.1)
Eosinophils Absolute: 0.3 10*3/uL (ref 0.0–0.7)
Eosinophils Relative: 8.3 % — ABNORMAL HIGH (ref 0.0–5.0)
HCT: 47.3 % (ref 39.0–52.0)
Hemoglobin: 16.3 g/dL (ref 13.0–17.0)
Lymphs Abs: 1.8 10*3/uL (ref 0.7–4.0)
MCV: 87.7 fl (ref 78.0–100.0)
WBC: 3.4 10*3/uL — ABNORMAL LOW (ref 4.5–10.5)

## 2011-07-04 LAB — LIPID PANEL
Cholesterol: 198 mg/dL (ref 0–200)
LDL Cholesterol: 123 mg/dL — ABNORMAL HIGH (ref 0–99)
Total CHOL/HDL Ratio: 3
Triglycerides: 60 mg/dL (ref 0.0–149.0)
VLDL: 12 mg/dL (ref 0.0–40.0)

## 2011-07-04 LAB — COMPREHENSIVE METABOLIC PANEL
AST: 25 U/L (ref 0–37)
Albumin: 4.4 g/dL (ref 3.5–5.2)
Alkaline Phosphatase: 46 U/L (ref 39–117)
BUN: 13 mg/dL (ref 6–23)
CO2: 31 mEq/L (ref 19–32)
Calcium: 9.3 mg/dL (ref 8.4–10.5)
GFR: 72.6 mL/min (ref >60.00)
Total Protein: 7.5 g/dL (ref 6.0–8.3)

## 2011-08-02 ENCOUNTER — Other Ambulatory Visit: Payer: BC Managed Care – PPO

## 2011-08-02 ENCOUNTER — Other Ambulatory Visit: Payer: Self-pay | Admitting: Internal Medicine

## 2011-08-02 DIAGNOSIS — Z1211 Encounter for screening for malignant neoplasm of colon: Secondary | ICD-10-CM

## 2012-01-23 ENCOUNTER — Telehealth: Payer: Self-pay | Admitting: Internal Medicine

## 2012-01-23 MED ORDER — AMLODIPINE BESYLATE 5 MG PO TABS: 5.0000 mg | ORAL_TABLET | Freq: Every day | ORAL | Status: DC

## 2012-01-23 NOTE — Telephone Encounter (Signed)
Refill: Amlodipine besylate 5 mg tab.Request for 90 day supply.

## 2012-01-23 NOTE — Telephone Encounter (Signed)
Refill done.  

## 2012-04-24 ENCOUNTER — Other Ambulatory Visit: Payer: Self-pay | Admitting: Internal Medicine

## 2012-04-24 NOTE — Telephone Encounter (Signed)
Refill done.  

## 2012-08-24 ENCOUNTER — Other Ambulatory Visit: Payer: Self-pay | Admitting: Internal Medicine

## 2012-08-25 NOTE — Telephone Encounter (Signed)
Steven Wood @ the pharmacy confirmed pt still has refills left on file. Refill request sent in error.

## 2012-12-02 ENCOUNTER — Encounter: Payer: Self-pay | Admitting: Internal Medicine

## 2012-12-02 ENCOUNTER — Ambulatory Visit (INDEPENDENT_AMBULATORY_CARE_PROVIDER_SITE_OTHER): Payer: BC Managed Care – PPO | Admitting: Internal Medicine

## 2012-12-02 VITALS — BP 126/84 | HR 78 | Temp 97.8°F | Ht 70.0 in | Wt 169.0 lb

## 2012-12-02 DIAGNOSIS — Z0000 Encounter for general adult medical examination without abnormal findings: Secondary | ICD-10-CM

## 2012-12-02 DIAGNOSIS — L309 Dermatitis, unspecified: Secondary | ICD-10-CM

## 2012-12-02 DIAGNOSIS — I10 Essential (primary) hypertension: Secondary | ICD-10-CM

## 2012-12-02 HISTORY — DX: Dermatitis, unspecified: L30.9

## 2012-12-02 LAB — PSA: PSA: 1.02 ng/mL (ref 0.10–4.00)

## 2012-12-02 LAB — LIPID PANEL
Cholesterol: 176 mg/dL (ref 0–200)
HDL: 57.4 mg/dL (ref >39.00)
Total CHOL/HDL Ratio: 3
Triglycerides: 79 mg/dL (ref 0.0–149.0)

## 2012-12-02 LAB — BASIC METABOLIC PANEL
CO2: 30 mEq/L (ref 19–32)
Calcium: 9.2 mg/dL (ref 8.4–10.5)
Potassium: 3.8 mEq/L (ref 3.5–5.1)

## 2012-12-02 MED ORDER — AMLODIPINE BESYLATE 5 MG PO TABS: 5.0000 mg | ORAL_TABLET | Freq: Every day | ORAL | Status: DC

## 2012-12-02 MED ORDER — CLOBETASOL PROPIONATE 0.05 % EX OINT: TOPICAL_OINTMENT | Freq: Two times a day (BID) | CUTANEOUS | Status: DC | PRN

## 2012-12-02 NOTE — Progress Notes (Signed)
  Subjective:    Patient ID: Steven Wood, male    DOB: 10-20-1956, 57 y.o.   MRN: [id number]  HPI CPX  Past Medical History  Diagnosis Date  . UTI (urinary tract infection)     UTI 2008, saw urology; patient reports a cystoscopy (told ok)  . Elbow fracture, right     no surgery  . HYPERTENSION, BORDERLINE 03/15/2009   Past Surgical History  Procedure Date  . Knee arthroscopy w/ meniscal repair remotely    Right   History   Social History  . Marital Status: Single    Spouse Name: N/A    Number of Children: 1  . Years of Education: N/A   Occupational History  . deliver , truck driver   .  [employer]   Social History Main Topics  . Smoking status: Former Smoker    Quit date: 11/12/2006  . Smokeless tobacco: Never Used  . Alcohol Use: No  . Drug Use: No  . Sexually Active: Not on file   Other Topics Concern  . Not on file   Social History Narrative   Married - wife is June Fulbright---1 child from previous marriage---Diet:healthy----Regular Exercise: active, also exercise routinely---   Family History  Problem Relation Age of Onset  . Breast cancer Mother   . Hypertension Mother   . Diabetes      M, Brother   . Stroke Brother   . Colon cancer Neg Hx   . Prostate cancer      Uncle  . CAD Neg Hx     Review of Systems Good medication compliance with BP meds. From time to time uses Temovate, Nizoral cream did not help. See assessment and plan. No chest pain or shortness of breath No nausea, vomiting, diarrhea. No blood in the stools. No dysuria gross hematuria, no difficulty urinating.    Objective:   Physical Exam General -- alert, well-developed, and well-nourished.   Neck --no thyromegaly , normal carotid pulse Lungs -- normal respiratory effort, no intercostal retractions, no accessory muscle use, and normal breath sounds.   Heart-- normal rate, regular rhythm, no murmur, and no gallop.   Abdomen--soft, non-tender, no distention, no masses,  no HSM, no guarding, and no rigidity.   Extremities-- no pretibial edema bilaterally Rectal-- No external abnormalities noted. Normal sphincter tone. No rectal masses or tenderness. No  Stool found  Prostate:  Prostate gland firm and smooth, no enlargement, nodularity, tenderness, mass, asymmetry or induration. Neurologic-- alert & oriented X3 and strength normal in all extremities. Psych-- Cognition and judgment appear intact. Alert and cooperative with normal attention span and concentration.  not anxious appearing and not depressed appearing.       Assessment & Plan:

## 2012-12-02 NOTE — Assessment & Plan Note (Signed)
Uses Temovate as needed only for dryness in the hands and occasional rash in the buttocks. Other creams have not helped as much.

## 2012-12-02 NOTE — Assessment & Plan Note (Signed)
Good medication compliance, ambulatory BPs 118/80. Continue with present care.

## 2012-12-02 NOTE — Assessment & Plan Note (Addendum)
Td 2008 Declined a flu shot   never had a cscope -- states is ready for a  colonoscopy to be done Fall 2014, states will call me praised  for his healthy lifestyle labs DRE  Today normal

## 2012-12-04 ENCOUNTER — Encounter: Payer: Self-pay | Admitting: Internal Medicine

## 2012-12-05 ENCOUNTER — Ambulatory Visit: Payer: BC Managed Care – PPO

## 2012-12-05 DIAGNOSIS — R7309 Other abnormal glucose: Secondary | ICD-10-CM

## 2013-01-13 ENCOUNTER — Telehealth: Payer: Self-pay | Admitting: *Deleted

## 2013-01-13 NOTE — Telephone Encounter (Signed)
Pts wife returned your call. CB# [phone]

## 2013-01-13 NOTE — Telephone Encounter (Signed)
lmovm for pt to return call  & schedule lab appt.   [facility] said they cannot find blood work. Please call pt back in  ----- Message ----- From: Wanda Plump, MD Sent: 01/10/2013 12:07 PM To: Vance Gather Subject: ordered a a1c, dx hyperglycemia, don't see t*

## 2013-01-14 NOTE — Telephone Encounter (Signed)
Spoke with pt's wife & advised her that the pt needs to call to schedule lab appt to re-draw A1C.

## 2013-03-14 ENCOUNTER — Other Ambulatory Visit: Payer: Self-pay | Admitting: Internal Medicine

## 2013-03-16 NOTE — Telephone Encounter (Signed)
Spoke with pharmacy, pt still has refills left on file. Refill request sent in error.  

## 2013-05-22 ENCOUNTER — Encounter: Payer: Self-pay | Admitting: Gastroenterology

## 2013-07-01 ENCOUNTER — Ambulatory Visit (AMBULATORY_SURGERY_CENTER): Payer: BC Managed Care – PPO | Admitting: *Deleted

## 2013-07-01 VITALS — Ht 69.0 in | Wt 172.4 lb

## 2013-07-01 DIAGNOSIS — Z1211 Encounter for screening for malignant neoplasm of colon: Secondary | ICD-10-CM

## 2013-07-01 MED ORDER — MOVIPREP 100 G PO SOLR: 1.0000 | Freq: Once | ORAL | Status: DC

## 2013-07-01 NOTE — Progress Notes (Signed)
NO EGG OR SOY ALLERGY. EWM NO PREVIOUS GI HX. EWM NO CPAP OR HOME 02 USE. EWM NO PROBLEMS WITH PAST SEDATION. EWM NO EMAIL FOR EMMI VIDEO. EWM

## 2013-07-02 ENCOUNTER — Encounter: Payer: Self-pay | Admitting: Gastroenterology

## 2013-07-14 ENCOUNTER — Ambulatory Visit (AMBULATORY_SURGERY_CENTER): Payer: BC Managed Care – PPO | Admitting: Gastroenterology

## 2013-07-14 ENCOUNTER — Encounter: Payer: Self-pay | Admitting: Gastroenterology

## 2013-07-14 VITALS — BP 123/76 | HR 85 | Temp 97.6°F | Resp 17 | Ht 69.0 in | Wt 172.0 lb

## 2013-07-14 DIAGNOSIS — D126 Benign neoplasm of colon, unspecified: Secondary | ICD-10-CM

## 2013-07-14 DIAGNOSIS — Z1211 Encounter for screening for malignant neoplasm of colon: Secondary | ICD-10-CM

## 2013-07-14 DIAGNOSIS — K5730 Diverticulosis of large intestine without perforation or abscess without bleeding: Secondary | ICD-10-CM

## 2013-07-14 HISTORY — PX: COLONOSCOPY: SHX174

## 2013-07-14 MED ORDER — SODIUM CHLORIDE 0.9 % IV SOLN: 500.0000 mL | INTRAVENOUS | Status: DC

## 2013-07-14 NOTE — Progress Notes (Signed)
Patient did not experience any of the following events: a burn prior to discharge; a fall within the facility; wrong site/side/patient/procedure/implant event; or a hospital transfer or hospital admission upon discharge from the facility. (G8907) Patient did not have preoperative order for IV antibiotic SSI prophylaxis. (G8918)  

## 2013-07-14 NOTE — Progress Notes (Signed)
Pt tolerated procedure well. ewm

## 2013-07-14 NOTE — Op Note (Signed)
 [facility] [address] [place], [address]   COLONOSCOPY PROCEDURE REPORT  PATIENT: Steven Wood, Steven Wood  MR#: [id number] BIRTHDATE: 1956/10/18 , 57  yrs. old GENDER: Male ENDOSCOPIST: Rachael Fee, MD REFERRED XB:JYNW Drue Novel, M.D. PROCEDURE DATE:  07/14/2013 PROCEDURE:   Colonoscopy with snare polypectomy First Screening Colonoscopy - Avg.  risk and is 50 yrs.  old or older Yes.  Prior Negative Screening - Now for repeat screening. N/A  History of Adenoma - Now for follow-up colonoscopy & has been > or = to 3 yrs.  N/A  Polyps Removed Today? Yes. ASA CLASS:   Class II INDICATIONS:average risk screening. MEDICATIONS: Fentanyl 50 mcg IV, Versed 5 mg IV, and These medications were titrated to patient response per physician's verbal order  DESCRIPTION OF PROCEDURE:   After the risks benefits and alternatives of the procedure were thoroughly explained, informed consent was obtained.  A digital rectal exam revealed no abnormalities of the rectum.   The LB [id number]  endoscope was introduced through the anus and advanced to the cecum, which was identified by both the appendix and ileocecal valve. No adverse events experienced.   The quality of the prep was good, using MoviPrep  The instrument was then slowly withdrawn as the colon was fully examined.  COLON FINDINGS: Two polyps were found, removed and sent to pathology.  These were both sessile, 2-81mm across, located in cecum and descending segments, removed with cold snare.  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  The examination was otherwise normal.  Retroflexed views revealed no abnormalities. The time to cecum=2 minutes 08 seconds.  Withdrawal time=9 minutes 52 seconds. The scope was withdrawn and the procedure completed. COMPLICATIONS: There were no complications.  ENDOSCOPIC IMPRESSION: Two polyps were found, removed and sent to pathology. 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 The  examination was otherwise normal.  RECOMMENDATIONS: If the polyp(s) removed today are proven to be adenomatous (pre-cancerous) polyps, you will need a repeat colonoscopy in 5 years.  Otherwise you should continue to follow colorectal cancer screening guidelines for "routine risk" patients with colonoscopy in 10 years.  You will receive a letter within 1-2 weeks with the results of your biopsy as well as final recommendations.  Please call my office if you have not received a letter after 3 weeks.  eSigned:  Rachael Fee, MD 07/14/2013 10:20 AM

## 2013-07-14 NOTE — Patient Instructions (Addendum)

## 2013-07-15 ENCOUNTER — Telehealth: Payer: Self-pay | Admitting: *Deleted

## 2013-07-15 NOTE — Telephone Encounter (Signed)
  Follow up Call-  Call back number 07/14/2013  Post procedure Call Back phone  # [phone] or [phone]  Permission to leave phone message Yes     Patient questions:  Do you have a fever, pain , or abdominal swelling? no Pain Score  0 *  Have you tolerated food without any problems? yes  Have you been able to return to your normal activities? yes  Do you have any questions about your discharge instructions: Diet   no Medications  no Follow up visit  no  Do you have questions or concerns about your Care? no  Actions: * If pain score is 4 or above: No action needed, pain <4.

## 2013-07-28 ENCOUNTER — Encounter: Payer: Self-pay | Admitting: Gastroenterology

## 2013-12-04 ENCOUNTER — Telehealth: Payer: Self-pay

## 2013-12-04 NOTE — Telephone Encounter (Signed)
Left message for call back. Non-identifiable  Flu Tdap- 11/12/2006 [facility]- 07/14/13-Dr. Baruch Merl polyps-one was adenomatous.  Repeat in 5 yrs. PSA- 12/02/12-1.02

## 2013-12-07 ENCOUNTER — Encounter: Payer: Self-pay | Admitting: Internal Medicine

## 2013-12-07 ENCOUNTER — Ambulatory Visit (INDEPENDENT_AMBULATORY_CARE_PROVIDER_SITE_OTHER): Payer: BC Managed Care – PPO | Admitting: Internal Medicine

## 2013-12-07 VITALS — BP 134/76 | HR 90 | Temp 98.2°F | Wt 171.0 lb

## 2013-12-07 DIAGNOSIS — Z0000 Encounter for general adult medical examination without abnormal findings: Secondary | ICD-10-CM

## 2013-12-07 NOTE — Progress Notes (Signed)
Pre visit review using our clinic review tool, if applicable. No additional management support is needed unless otherwise documented below in the visit note. 

## 2013-12-07 NOTE — Patient Instructions (Signed)
   Next visit is for a physical exam in 1 year, fasting  Check the  blood pressure 2  times a month  be sure it is between 110/60 and 140/85. Ideal blood pressure is 120/80. If it is consistently higher or lower, let me know

## 2013-12-07 NOTE — Progress Notes (Signed)
Subjective:    Patient ID: Steven Wood, male    DOB: [birth year], 58 y.o.   MRN: [id number]  HPI CPX  Past Medical History  Diagnosis Date  . UTI (urinary tract infection)     UTI 2008, saw urology; patient reports a cystoscopy (told ok)  . Elbow fracture, right     no surgery  . HYPERTENSION, BORDERLINE 03/15/2009   Past Surgical History  Procedure Laterality Date  . Knee arthroscopy w/ meniscal repair  remotely    Right   Family History  Problem Relation Age of Onset  . Breast cancer Mother   . Hypertension Mother   . Diabetes Mother     Jerilynn Mages, Brother   . Stroke Brother   . Colon cancer Neg Hx   . CAD Neg Hx   . Prostate cancer Other     Uncle   History   Social History  . Marital Status: Single    Spouse Name: N/A    Number of Children: 1  . Years of Education: N/A   Occupational History  . deliver , truck driver    Social History Main Topics  . Smoking status: Former Smoker    Quit date: 11/12/2006  . Smokeless tobacco: Never Used  . Alcohol Use: No  . Drug Use: No  . Sexual Activity: Not on file   Other Topics Concern  . Not on file   Social History Narrative   Married - wife is June Maisano    1 child from previous marriage                Review of Systems Diet-- healthy Exercise-- stay active  When asked, reports that about a month or 2 ago  had an episode of chest pain: lasted all day,  mild, not pressure-like,  anteriorly located, no radiation to the neck or arms, at rest; not associated w/ nausea, vomiting, diaphoresis or shortness of breath. At that time, he was not experiencing any pain or swelling in the calves. No further symptoms.  No  SOB, lower extremity edema Denies  nausea, vomiting diarrhea Denies  blood in the stools + GERD  Sx x a while, usually after meals, decrease w/  Baking soda No dysphagia or odynophagia (-) cough No dysuria, gross hematuria, difficulty urinating   No anxiety, depression      Objective:   Physical Exam BP 134/76  Pulse 90  Temp(Src) 98.2 F (36.8 C)  Wt 171 lb (77.565 kg)  SpO2 96% General -- alert, well-developed, NAD.  Neck --no thyromegaly , normal carotid pulse  HEENT-- Not pale.  Lungs -- normal respiratory effort, no intercostal retractions, no accessory muscle use, and normal breath sounds.  Heart-- normal rate, regular rhythm, no murmur.  Abdomen-- Not distended, good bowel sounds,soft, non-tender. Rectal-- No external abnormalities noted. Normal sphincter tone. No rectal masses or tenderness. Brown stool  Prostate--Prostate gland firm and smooth, no enlargement, nodularity, tenderness, mass, asymmetry or induration. Extremities-- no pretibial edema bilaterally  Neurologic--  alert & oriented X3. Speech normal, gait normal, strength normal in all extremities.  Psych-- Cognition and judgment appear intact. Cooperative with normal attention span and concentration. No anxious or depressed appearing.      Assessment & Plan:    Chest pain, 58 year old gentleman with hypertension, treated;  no family history of heart disease reports a single episode of chest pain that lasted all day. No further symptoms. EKG today: nsr, no change Plan: Discussed observation vs stress test (as  a way to be more certain about meaning of CP), pt elected obs, rec ER if sx severe

## 2013-12-07 NOTE — Assessment & Plan Note (Addendum)
Td 2008 Declined a flu shot  cscope -- 07/14/13-Dr. Baruch Merl polyps-one was adenomatous. Repeat in 5 yrs.  Cont  healthy lifestyle labs

## 2013-12-08 ENCOUNTER — Other Ambulatory Visit: Payer: Self-pay | Admitting: Internal Medicine

## 2013-12-08 LAB — COMPREHENSIVE METABOLIC PANEL
ALT: 39 U/L (ref 0–53)
AST: 24 U/L (ref 0–37)
Albumin: 3.9 g/dL (ref 3.5–5.2)
Alkaline Phosphatase: 48 U/L (ref 39–117)
BILIRUBIN TOTAL: 0.8 mg/dL (ref 0.3–1.2)
BUN: 14 mg/dL (ref 6–23)
CALCIUM: 9.6 mg/dL (ref 8.4–10.5)
CHLORIDE: 106 meq/L (ref 96–112)
CO2: 31 mEq/L (ref 19–32)
Creatinine, Ser: 1.7 mg/dL — ABNORMAL HIGH (ref 0.4–1.5)
GFR: 53.74 mL/min — ABNORMAL LOW (ref >60.00)
Glucose, Bld: 110 mg/dL — ABNORMAL HIGH (ref 70–99)
Potassium: 4.6 mEq/L (ref 3.5–5.1)
Sodium: 141 mEq/L (ref 135–145)
Total Protein: 7 g/dL (ref 6.0–8.3)

## 2013-12-08 LAB — CBC WITH DIFFERENTIAL/PLATELET
BASOS PCT: 0.5 % (ref 0.0–3.0)
Basophils Absolute: 0 10*3/uL (ref 0.0–0.1)
Eosinophils Absolute: 0.3 10*3/uL (ref 0.0–0.7)
Eosinophils Relative: 5.8 % — ABNORMAL HIGH (ref 0.0–5.0)
HEMATOCRIT: 48.2 % (ref 39.0–52.0)
HEMOGLOBIN: 16.2 g/dL (ref 13.0–17.0)
LYMPHS PCT: 36.3 % (ref 12.0–46.0)
Lymphs Abs: 1.7 10*3/uL (ref 0.7–4.0)
MCHC: 33.7 g/dL (ref 30.0–36.0)
MCV: 85.7 fl (ref 78.0–100.0)
MONO ABS: 0.3 10*3/uL (ref 0.1–1.0)
Monocytes Relative: 7 % (ref 3.0–12.0)
NEUTROS ABS: 2.4 10*3/uL (ref 1.4–7.7)
Neutrophils Relative %: 50.4 % (ref 43.0–77.0)
Platelets: 283 10*3/uL (ref 150.0–400.0)
RBC: 5.62 Mil/uL (ref 4.22–5.81)
RDW: 13.1 % (ref 11.5–14.6)
WBC: 4.7 10*3/uL (ref 4.5–10.5)

## 2013-12-08 LAB — PSA: PSA: 1.08 ng/mL (ref 0.10–4.00)

## 2013-12-08 LAB — LIPID PANEL
CHOL/HDL RATIO: 3
Cholesterol: 185 mg/dL (ref 0–200)
HDL: 63.9 mg/dL (ref >39.00)
LDL Cholesterol: 113 mg/dL — ABNORMAL HIGH (ref 0–99)
Triglycerides: 41 mg/dL (ref 0.0–149.0)
VLDL: 8.2 mg/dL (ref 0.0–40.0)

## 2013-12-08 LAB — TSH: TSH: 0.27 u[IU]/mL — ABNORMAL LOW (ref 0.35–5.50)

## 2013-12-08 NOTE — Telephone Encounter (Signed)
Unable to reach pre visit.  

## 2013-12-08 NOTE — Telephone Encounter (Signed)
Amlodipine refilled per protocol. JG//CMA 

## 2013-12-10 ENCOUNTER — Other Ambulatory Visit: Payer: Self-pay | Admitting: Internal Medicine

## 2013-12-10 ENCOUNTER — Encounter: Payer: Self-pay | Admitting: *Deleted

## 2013-12-10 DIAGNOSIS — R7989 Other specified abnormal findings of blood chemistry: Secondary | ICD-10-CM

## 2013-12-10 DIAGNOSIS — R748 Abnormal levels of other serum enzymes: Secondary | ICD-10-CM

## 2013-12-10 DIAGNOSIS — E785 Hyperlipidemia, unspecified: Secondary | ICD-10-CM

## 2014-01-23 ENCOUNTER — Telehealth: Payer: Self-pay | Admitting: Internal Medicine

## 2014-01-23 NOTE — Telephone Encounter (Signed)
Due for labs, f/u from results 11-2013. plase arrange: BMP --- dx increased creatinine A1c--- Hyperglycemia  TSH, free T3, free T4---- abnormal TSH

## 2014-01-26 NOTE — Telephone Encounter (Signed)
LMOVM

## 2014-02-03 ENCOUNTER — Telehealth: Payer: Self-pay | Admitting: Internal Medicine

## 2014-02-03 NOTE — Telephone Encounter (Signed)
Patient wife called and requested a referral to an Urologist for her husband. 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 Please advise.

## 2014-02-03 NOTE — Telephone Encounter (Signed)
Spoke with wife and advised that patient needs to come to the office and be seen before referral (needs further labs per 11/2013 labs.  States that no one every called them and advise that he needed to return. Wife readily admits that this has happened before with CDL's and her never followed up as he was suppose too. Per the lab notes the patient was notified of the results and the orders were placed for him to return. Wife denies that he was informed.  Advised that he needs to come in. States she will call back to make an appt.

## 2014-02-16 ENCOUNTER — Encounter: Payer: Self-pay | Admitting: Internal Medicine

## 2014-02-16 ENCOUNTER — Ambulatory Visit (INDEPENDENT_AMBULATORY_CARE_PROVIDER_SITE_OTHER): Payer: BC Managed Care – PPO | Admitting: Internal Medicine

## 2014-02-16 ENCOUNTER — Other Ambulatory Visit (INDEPENDENT_AMBULATORY_CARE_PROVIDER_SITE_OTHER): Payer: BC Managed Care – PPO

## 2014-02-16 VITALS — BP 129/79 | HR 80 | Temp 97.9°F | Wt 175.0 lb

## 2014-02-16 DIAGNOSIS — R946 Abnormal results of thyroid function studies: Secondary | ICD-10-CM

## 2014-02-16 DIAGNOSIS — E785 Hyperlipidemia, unspecified: Secondary | ICD-10-CM

## 2014-02-16 DIAGNOSIS — R7989 Other specified abnormal findings of blood chemistry: Secondary | ICD-10-CM

## 2014-02-16 DIAGNOSIS — R748 Abnormal levels of other serum enzymes: Secondary | ICD-10-CM

## 2014-02-16 DIAGNOSIS — R739 Hyperglycemia, unspecified: Secondary | ICD-10-CM

## 2014-02-16 DIAGNOSIS — I10 Essential (primary) hypertension: Secondary | ICD-10-CM

## 2014-02-16 LAB — BASIC METABOLIC PANEL
BUN: 13 mg/dL (ref 6–23)
CALCIUM: 9.4 mg/dL (ref 8.4–10.5)
CO2: 28 mEq/L (ref 19–32)
CREATININE: 1.4 mg/dL (ref 0.4–1.5)
Chloride: 103 mEq/L (ref 96–112)
GFR: 67.19 mL/min (ref >60.00)
GLUCOSE: 110 mg/dL — AB (ref 70–99)
Potassium: 3.8 mEq/L (ref 3.5–5.1)
SODIUM: 139 meq/L (ref 135–145)

## 2014-02-16 LAB — URINALYSIS, ROUTINE W REFLEX MICROSCOPIC
Bilirubin Urine: NEGATIVE
Hgb urine dipstick: NEGATIVE
Ketones, ur: NEGATIVE
LEUKOCYTES UA: NEGATIVE
Nitrite: NEGATIVE
SPECIFIC GRAVITY, URINE: 1.02 (ref 1.000–1.030)
Total Protein, Urine: NEGATIVE
UROBILINOGEN UA: 0.2 (ref 0.0–1.0)
Urine Glucose: NEGATIVE
pH: 6 (ref 5.0–8.0)

## 2014-02-16 LAB — T4, FREE: Free T4: 0.78 ng/dL (ref 0.60–1.60)

## 2014-02-16 LAB — MICROALBUMIN / CREATININE URINE RATIO
Creatinine,U: 142.7 mg/dL
MICROALB UR: 0.3 mg/dL (ref 0.0–1.9)
Microalb Creat Ratio: 0.2 mg/g (ref 0.0–30.0)

## 2014-02-16 LAB — T3, FREE: T3 FREE: 3.1 pg/mL (ref 2.3–4.2)

## 2014-02-16 LAB — TSH: TSH: 0.36 u[IU]/mL (ref 0.35–5.50)

## 2014-02-16 LAB — HEMOGLOBIN A1C: Hgb A1c MFr Bld: 6.2 % (ref 4.6–6.5)

## 2014-02-16 NOTE — Patient Instructions (Addendum)
  Get your blood work before you leave   Next visit is for routine check up   in 3 months  No need to come back fasting Please make an appointment

## 2014-02-16 NOTE — Progress Notes (Signed)
   Subjective:    Patient ID: Steven Wood, male    DOB: Mar 10, 1956, 58 y.o.   MRN: [id number]  DOS:  02/16/2014 Type of  visit:  Followup from previous visit   At the time of his physical, labs were slightly abnormal: Suppressed TSH, increased creatinine, Increased blood sugar. Additionally he went for his CDL, was told he had blood and protein in the urine.  ROS No fever or chills. No weight loss. BPs are checked sporadically, usually normal Color of the urine is dark to clear depending on the amount of water he drinks. No foamy urine. No dysuria or gross hematuria. Denies taking Motrin or similar medications  Past Medical History  Diagnosis Date  . UTI (urinary tract infection)     UTI 2008, saw urology; patient reports a cystoscopy (told ok)  . Elbow fracture, right     no surgery  . HYPERTENSION, BORDERLINE 03/15/2009    Past Surgical History  Procedure Laterality Date  . Knee arthroscopy w/ meniscal repair  remotely    Right    History   Social History  . Marital Status: Single    Spouse Name: N/A    Number of Children: 1  . Years of Education: N/A   Occupational History  . deliver , truck driver    Social History Main Topics  . Smoking status: Former Smoker    Quit date: 11/12/2006  . Smokeless tobacco: Never Used  . Alcohol Use: No  . Drug Use: No  . Sexual Activity: Not on file   Other Topics Concern  . Not on file   Social History Narrative   Married - wife is June Stivers    1 child from previous marriage                   Medication List       This list is accurate as of: 02/16/14  5:52 PM.  Always use your most recent med list.               amLODipine 5 MG tablet  Commonly known as:  NORVASC  TAKE 1 TABLET (5 MG TOTAL) BY MOUTH DAILY.     aspirin 81 MG tablet  Take 81 mg by mouth daily.     clobetasol ointment 0.05 %  Commonly known as:  TEMOVATE  Apply topically 2 (two) times daily as needed.           Objective:   Physical Exam BP 129/79  Pulse 80  Temp(Src) 97.9 F (36.6 C)  Wt 175 lb (79.379 kg)  SpO2 94% General -- alert, well-developed, NAD.  Neck --no thyromegaly   Lungs -- normal respiratory effort, no intercostal retractions, no accessory muscle use, and normal breath sounds.  Heart-- normal rate, regular rhythm, no murmur.   Extremities-- no pretibial edema bilaterally   Psych-- Cognition and judgment appear intact. Cooperative with normal attention span and concentration. No anxious or depressed appearing.        Assessment & Plan:    Today , I spent more than 25 min with the patient, >50% of the time counseling--Patient has multiple questions about his kidney, kidney function and urine.  Wife recommended to see a urologist, pt does not think so. Explained that based on the results  we may need to do referral probably to nephrology

## 2014-02-16 NOTE — Assessment & Plan Note (Signed)
Slightly suppressed TSH, check TFTs

## 2014-02-16 NOTE — Progress Notes (Signed)
Pre visit review using our clinic review tool, if applicable. No additional management support is needed unless otherwise documented below in the visit note. 

## 2014-02-16 NOTE — Assessment & Plan Note (Signed)
On amlodipine, BP well-controlled. Last creatinine slightly elevated, was told he had blood and protein in the urine elsewhere (CDL) Plan: BMP, UA, microalbumin. Also had   hyperglycemia, check a A1c

## 2014-02-17 ENCOUNTER — Telehealth: Payer: Self-pay | Admitting: Internal Medicine

## 2014-02-17 NOTE — Telephone Encounter (Signed)
Relevant patient education mailed to patient.  

## 2014-02-19 ENCOUNTER — Encounter: Payer: Self-pay | Admitting: *Deleted

## 2014-06-05 ENCOUNTER — Other Ambulatory Visit: Payer: Self-pay | Admitting: Internal Medicine

## 2014-11-29 ENCOUNTER — Ambulatory Visit: Payer: Managed Care, Other (non HMO) | Admitting: Internal Medicine

## 2014-11-30 ENCOUNTER — Encounter: Payer: Self-pay | Admitting: Internal Medicine

## 2014-11-30 ENCOUNTER — Ambulatory Visit (INDEPENDENT_AMBULATORY_CARE_PROVIDER_SITE_OTHER): Payer: BLUE CROSS/BLUE SHIELD | Admitting: Internal Medicine

## 2014-11-30 ENCOUNTER — Telehealth: Payer: Self-pay | Admitting: Internal Medicine

## 2014-11-30 VITALS — BP 132/81 | HR 90 | Temp 97.9°F | Ht 69.0 in | Wt 170.0 lb

## 2014-11-30 DIAGNOSIS — R7309 Other abnormal glucose: Secondary | ICD-10-CM

## 2014-11-30 DIAGNOSIS — R399 Unspecified symptoms and signs involving the genitourinary system: Secondary | ICD-10-CM

## 2014-11-30 DIAGNOSIS — R7303 Prediabetes: Secondary | ICD-10-CM

## 2014-11-30 DIAGNOSIS — Z0000 Encounter for general adult medical examination without abnormal findings: Secondary | ICD-10-CM

## 2014-11-30 DIAGNOSIS — I10 Essential (primary) hypertension: Secondary | ICD-10-CM

## 2014-11-30 HISTORY — DX: Prediabetes: R73.03

## 2014-11-30 HISTORY — DX: Unspecified symptoms and signs involving the genitourinary system: R39.9

## 2014-11-30 LAB — URINALYSIS, ROUTINE W REFLEX MICROSCOPIC
Bilirubin Urine: NEGATIVE
KETONES UR: NEGATIVE
LEUKOCYTES UA: NEGATIVE
Nitrite: NEGATIVE
PH: 6 (ref 5.0–8.0)
SPECIFIC GRAVITY, URINE: 1.02 (ref 1.000–1.030)
TOTAL PROTEIN, URINE-UPE24: NEGATIVE
Urine Glucose: NEGATIVE
Urobilinogen, UA: 0.2 (ref 0.0–1.0)

## 2014-11-30 LAB — LIPID PANEL
CHOL/HDL RATIO: 3
CHOLESTEROL: 152 mg/dL (ref 0–200)
HDL: 57.2 mg/dL (ref >39.00)
LDL Cholesterol: 84 mg/dL (ref 0–99)
NonHDL: 94.8
Triglycerides: 55 mg/dL (ref 0.0–149.0)
VLDL: 11 mg/dL (ref 0.0–40.0)

## 2014-11-30 LAB — HEMOGLOBIN A1C: Hgb A1c MFr Bld: 6.5 % (ref 4.6–6.5)

## 2014-11-30 LAB — PSA: PSA: 1.68 ng/mL (ref 0.10–4.00)

## 2014-11-30 NOTE — Telephone Encounter (Signed)
Waist circumference was an "optional" or not required measurement on the form, however, I will place Pts circumference on form prior to faxing.

## 2014-11-30 NOTE — Telephone Encounter (Signed)
Caller name: june Relation to pt: wife Call back number:  [phone] Pharmacy:  Reason for call:   Patient wife called in stating that patient waist measurement was not taken at todays visit. Will need this filled out before faxed. Patient states that his waist measurement is 34

## 2014-11-30 NOTE — Progress Notes (Signed)
   Subjective:    Patient ID: Rodgers Likes, male    DOB: [birth year], 59 y.o.   MRN: [id number]  DOS:  11/30/2014 Type of visit - description : acute Interval history: Needs paperwork signed for his job. Also, for a while has notice a increase in the urinary frequency, sometimes with very little urine. Symptoms worse when he drinks a lot of caffeine. High blood pressure, good compliance with amlodipine, sometimes check ambulatory BPs and they are okay.    ROS Denies fever chills No dysuria, gross hematuria or difficulty urinating.  Past Medical History  Diagnosis Date  . UTI (urinary tract infection)     UTI 2008, saw urology; patient reports a cystoscopy (told ok)  . Elbow fracture, right     no surgery  . HYPERTENSION, BORDERLINE 03/15/2009    Past Surgical History  Procedure Laterality Date  . Knee arthroscopy w/ meniscal repair  remotely    Right    History   Social History  . Marital Status: Single    Spouse Name: N/A    Number of Children: 1  . Years of Education: N/A   Occupational History  . deliver , truck driver    Social History Main Topics  . Smoking status: Former Smoker    Quit date: 11/12/2006  . Smokeless tobacco: Never Used  . Alcohol Use: No  . Drug Use: No  . Sexual Activity: Not on file   Other Topics Concern  . Not on file   Social History Narrative   Married - wife is June Adcox    1 child from previous marriage                   Medication List       This list is accurate as of: 11/30/14  6:05 PM.  Always use your most recent med list.               amLODipine 5 MG tablet  Commonly known as:  NORVASC  TAKE 1 TABLET BY MOUTH EVERY DAY     aspirin 81 MG tablet  Take 81 mg by mouth daily.     clobetasol ointment 0.05 %  Commonly known as:  TEMOVATE  Apply topically 2 (two) times daily as needed.           Objective:   Physical Exam BP 132/81 mmHg  Pulse 90  Temp(Src) 97.9 F (36.6 C) (Oral)  Ht 5\' 9"  (1.753  m)  Wt 170 lb (77.111 kg)  BMI 25.09 kg/m2  SpO2 96% General -- alert, well-developed, NAD.  Lungs -- normal respiratory effort, no intercostal retractions, no accessory muscle use, and normal breath sounds.  Heart-- normal rate, regular rhythm, no murmur.  Abdomen-- Not distended, good bowel sounds,soft, non-tender. Rectal-- No external abnormalities noted. Normal sphincter tone. No rectal masses or tenderness. No stools  Prostate--Prostate gland firm and smooth, no enlargement, nodularity, tenderness, mass, asymmetry or induration. Extremities-- no pretibial edema bilaterally  Neurologic--  alert & oriented X3. Speech normal, gait appropriate for age, strength symmetric and appropriate for age.  Psych-- Cognition and judgment appear intact. Cooperative with normal attention span and concentration. No anxious or depressed appearing.     Assessment & Plan:

## 2014-11-30 NOTE — Assessment & Plan Note (Signed)
Needs paperwork completed for his office, will check FLP, come back in 3 or 4 months for a full physical exam

## 2014-11-30 NOTE — Patient Instructions (Signed)
Get your blood work before you leave    Please come back to the office in 3 months  for a physical exam. no fasting

## 2014-11-30 NOTE — Assessment & Plan Note (Signed)
He 6.2, he has prediabetes, recheck a A1c

## 2014-11-30 NOTE — Assessment & Plan Note (Signed)
urinary frequency, DDX includes overactive bladder, BPH, urinary tract infection. DRE essentially negative, will check a UA, urine culture, PSA. Recommend to avoid bladder irritants and drink plenty of fluids.

## 2014-11-30 NOTE — Progress Notes (Signed)
Pre visit review using our clinic review tool, if applicable. No additional management support is needed unless otherwise documented below in the visit note. 

## 2014-12-01 LAB — URINE CULTURE
Colony Count: NO GROWTH
ORGANISM ID, BACTERIA: NO GROWTH

## 2014-12-02 NOTE — Telephone Encounter (Signed)
Received completed insurance form from Dr. Larose Kells. Faxed to [phone]. Copy made and sent for scanning. Original copy mailed back to Pt.

## 2014-12-06 ENCOUNTER — Ambulatory Visit: Payer: BC Managed Care – PPO | Admitting: Internal Medicine

## 2014-12-29 ENCOUNTER — Other Ambulatory Visit: Payer: Self-pay | Admitting: Internal Medicine

## 2015-03-10 ENCOUNTER — Telehealth: Payer: Self-pay | Admitting: Internal Medicine

## 2015-03-10 NOTE — Telephone Encounter (Signed)
[place] PHYSICAL MAILED

## 2015-03-30 ENCOUNTER — Telehealth: Payer: Self-pay | Admitting: *Deleted

## 2015-03-30 NOTE — Telephone Encounter (Signed)
Unable to reach patient at time of Pre-Visit Call.  Left message for patient to return call when available.    

## 2015-03-31 ENCOUNTER — Encounter: Payer: Self-pay | Admitting: Internal Medicine

## 2015-03-31 ENCOUNTER — Ambulatory Visit (INDEPENDENT_AMBULATORY_CARE_PROVIDER_SITE_OTHER): Payer: Commercial Managed Care - PPO | Admitting: Internal Medicine

## 2015-03-31 VITALS — BP 126/78 | HR 82 | Temp 98.1°F | Ht 69.0 in | Wt 170.5 lb

## 2015-03-31 DIAGNOSIS — Z0000 Encounter for general adult medical examination without abnormal findings: Secondary | ICD-10-CM | POA: Diagnosis not present

## 2015-03-31 DIAGNOSIS — R7309 Other abnormal glucose: Secondary | ICD-10-CM

## 2015-03-31 DIAGNOSIS — R7303 Prediabetes: Secondary | ICD-10-CM

## 2015-03-31 LAB — URINALYSIS, ROUTINE W REFLEX MICROSCOPIC
Bilirubin Urine: NEGATIVE
Ketones, ur: NEGATIVE
Leukocytes, UA: NEGATIVE
Nitrite: NEGATIVE
RBC / HPF: NONE SEEN (ref 0–?)
SPECIFIC GRAVITY, URINE: 1.02 (ref 1.000–1.030)
TOTAL PROTEIN, URINE-UPE24: NEGATIVE
URINE GLUCOSE: NEGATIVE
UROBILINOGEN UA: 0.2 (ref 0.0–1.0)
pH: 6 (ref 5.0–8.0)

## 2015-03-31 LAB — HEMOGLOBIN A1C: Hgb A1c MFr Bld: 5.9 % (ref 4.6–6.5)

## 2015-03-31 LAB — MICROALBUMIN / CREATININE URINE RATIO
CREATININE, U: 126.7 mg/dL
Microalb Creat Ratio: 0.6 mg/g (ref 0.0–30.0)

## 2015-03-31 LAB — COMPREHENSIVE METABOLIC PANEL
ALK PHOS: 50 U/L (ref 39–117)
ALT: 35 U/L (ref 0–53)
AST: 20 U/L (ref 0–37)
Albumin: 4.2 g/dL (ref 3.5–5.2)
BILIRUBIN TOTAL: 0.6 mg/dL (ref 0.2–1.2)
BUN: 14 mg/dL (ref 6–23)
CO2: 32 mEq/L (ref 19–32)
CREATININE: 1.34 mg/dL (ref 0.40–1.50)
Calcium: 9.7 mg/dL (ref 8.4–10.5)
Chloride: 102 mEq/L (ref 96–112)
GFR: 70.39 mL/min (ref >60.00)
Glucose, Bld: 115 mg/dL — ABNORMAL HIGH (ref 70–99)
Potassium: 4 mEq/L (ref 3.5–5.1)
Sodium: 138 mEq/L (ref 135–145)
Total Protein: 7.3 g/dL (ref 6.0–8.3)

## 2015-03-31 LAB — TSH: TSH: 0.87 u[IU]/mL (ref 0.35–4.50)

## 2015-03-31 MED ORDER — CLOBETASOL PROPIONATE 0.05 % EX OINT: TOPICAL_OINTMENT | Freq: Two times a day (BID) | CUTANEOUS | Status: AC | PRN

## 2015-03-31 MED ORDER — AMLODIPINE BESYLATE 5 MG PO TABS: 5.0000 mg | ORAL_TABLET | Freq: Every day | ORAL | Status: DC

## 2015-03-31 NOTE — Progress Notes (Signed)
Pre visit review using our clinic review tool, if applicable. No additional management support is needed unless otherwise documented below in the visit note. 

## 2015-03-31 NOTE — Progress Notes (Addendum)
Subjective:    Patient ID: Steven Wood, male    DOB: 02/13/1956, 59 y.o.   MRN: [id number]  DOS:  03/31/2015 Type of visit - description : cpx Interval history: In general feeling okay, no concerns, request labs and urine tests.   Review of Systems Constitutional: No fever, chills. No unexplained wt changes. No unusual sweats HEENT: No dental problems, ear discharge, facial swelling, voice changes. No eye discharge, redness or intolerance to light Respiratory: No wheezing or difficulty breathing. No cough , mucus production Cardiovascular: No CP, leg swelling or palpitations GI: no nausea, vomiting, diarrhea or abdominal pain.  No blood in the stools. No dysphagia   Endocrine: No polyphagia, polyuria or polydipsia GU: No dysuria, gross hematuria, difficulty urinating. No urinary urgency or frequency. Musculoskeletal: No joint swellings or unusual aches or pains Skin: No change in the color of the skin, palor or rash Allergic, immunologic: No environmental allergies or food allergies Neurological: No dizziness or syncope. No headaches. No diplopia, slurred speech, motor deficits, facial numbness Hematological: No enlarged lymph nodes, easy bruising or bleeding Psychiatry: No suicidal ideas, hallucinations, behavior problems or confusion. No unusual/severe anxiety or depression.     Past Medical History  Diagnosis Date  . UTI (urinary tract infection)     UTI 2008, saw urology; patient reports a cystoscopy (told ok)  . Elbow fracture, right     no surgery  . HYPERTENSION, BORDERLINE 03/15/2009  . Chronic dermatitis 12/02/2012  . Lower urinary tract symptoms (LUTS) 11/30/2014  . Prediabetes 11/30/2014    Past Surgical History  Procedure Laterality Date  . Knee arthroscopy w/ meniscal repair  remotely    Right    History   Social History  . Marital Status: Single    Spouse Name: N/A  . Number of Children: 1  . Years of Education: N/A   Occupational History  . deliver  , truck driver    Social History Main Topics  . Smoking status: Former Smoker    Quit date: 11/12/2006  . Smokeless tobacco: Never Used  . Alcohol Use: No  . Drug Use: No  . Sexual Activity: Not on file   Other Topics Concern  . Not on file   Social History Narrative   Married - wife is June Staff    1 child from previous marriage                Family History  Problem Relation Age of Onset  . Breast cancer Mother   . Hypertension Mother   . Diabetes Mother     Jerilynn Mages, Brother   . Stroke Brother   . Colon cancer Neg Hx   . CAD Neg Hx   . Prostate cancer Other     Uncle       Medication List       This list is accurate as of: 03/31/15  5:30 PM.  Always use your most recent med list.               amLODipine 5 MG tablet  Commonly known as:  NORVASC  Take 1 tablet (5 mg total) by mouth daily.     aspirin 81 MG tablet  Take 81 mg by mouth daily.     clobetasol ointment 0.05 %  Commonly known as:  TEMOVATE  Apply topically 2 (two) times daily as needed.           Objective:   Physical Exam BP 126/78 mmHg  Pulse 82  Temp(Src) 98.1 F (36.7 C) (Oral)  Ht 5\' 9"  (1.753 m)  Wt 170 lb 8 oz (77.338 kg)  BMI 25.17 kg/m2  SpO2 98%  General:   Well developed, well nourished . NAD.  Neck:  Full range of motion. Supple.   HEENT:  Normocephalic . Face symmetric, atraumatic Lungs:  CTA B Normal respiratory effort, no intercostal retractions, no accessory muscle use. Heart: RRR,  no murmur.  No pretibial edema bilaterally  Abdomen:  Not distended, soft, non-tender. No rebound or rigidity. No mass,organomegaly Skin: Exposed areas without rash. Not pale. Not jaundice Neurologic:  alert & oriented X3.  Speech normal, gait appropriate for age and unassisted Strength symmetric and appropriate for age.  Psych: Cognition and judgment appear intact.  Cooperative with normal attention span and concentration.  Behavior appropriate. No anxious or depressed  appearing.       Assessment & Plan:     Chronic dermatitis, uses clobetasol as needed only. Refill provided Hypertension, on amlodipine, well-controlled, refill. Prediabetes, checking a A1c and microalbumin.. Diet and exercise discussed  Return to the office 6 months

## 2015-03-31 NOTE — Patient Instructions (Signed)
Get your blood work before you leave    Come back to the office in 6 months   for a routine check up

## 2015-03-31 NOTE — Assessment & Plan Note (Addendum)
Td 2008  pnm shot-- declined  cscope -- 07/14/13-Dr. Baruch Merl polyps-one was adenomatous. Repeat in 5 yrs.  Prostate cancer screening: DRE negative 11-2014, PSAs consistently normal. Has an uncle with prostate cancer. Reassess next year Very active at work, no routine exercise. Counseling about diet labs

## 2015-04-01 LAB — HIV ANTIBODY (ROUTINE TESTING W REFLEX): HIV: NONREACTIVE

## 2015-04-04 NOTE — Addendum Note (Signed)
Addended by: Kathlene November E on: 04/04/2015 09:21 AM   Modules accepted: Miquel Dunn

## 2015-12-08 ENCOUNTER — Telehealth: Payer: Self-pay | Admitting: Internal Medicine

## 2015-12-08 NOTE — Telephone Encounter (Signed)
Patient's wife (June Hopkin) called to confirm times for their CPE's on 2/1. Upon checking both patients, she was informed that she had an appt scheduled but this patient did not. Wife stated that when she called on 08/01/2015 to schedule, she was assured by Scheduler that both she and this patient were scheduled. Wife was upset and demanded that he be added to the schedule for a CPE on 2/1. Spoke with Team Leader [place]) and after she researched she found that this patient's last CPE was done 03/31/2015 and he was not due for one until 03/31/2016. Explained this to the wife but she was still upset and insisted that he be scheduled for 2/1. She stated that he had to have a CPE prior to 2/28. Offered her an appt for 2/15 but she declined because she said they would be out of town. Next available CPE was not until after the 2/28 deadline that she had. 33 minutes was spent on this call trying to please her and help her understand. She blamed the person that scheduled the appt in Sept. 2016 on being incapable of doing her job. After 33 minutes, patient hung up.

## 2016-05-11 ENCOUNTER — Emergency Department (HOSPITAL_BASED_OUTPATIENT_CLINIC_OR_DEPARTMENT_OTHER)
Admission: EM | Admit: 2016-05-11 | Discharge: 2016-05-11 | Disposition: A | Discharge status: Home / Self Care | Payer: Commercial Managed Care - PPO | Attending: Emergency Medicine | Admitting: Emergency Medicine

## 2016-05-11 ENCOUNTER — Encounter (HOSPITAL_BASED_OUTPATIENT_CLINIC_OR_DEPARTMENT_OTHER): Payer: Self-pay

## 2016-05-11 DIAGNOSIS — I10 Essential (primary) hypertension: Secondary | ICD-10-CM | POA: Insufficient documentation

## 2016-05-11 DIAGNOSIS — Z7982 Long term (current) use of aspirin: Secondary | ICD-10-CM | POA: Diagnosis not present

## 2016-05-11 DIAGNOSIS — Y939 Activity, unspecified: Secondary | ICD-10-CM | POA: Diagnosis not present

## 2016-05-11 DIAGNOSIS — L089 Local infection of the skin and subcutaneous tissue, unspecified: Secondary | ICD-10-CM | POA: Diagnosis not present

## 2016-05-11 DIAGNOSIS — S00211A Abrasion of right eyelid and periocular area, initial encounter: Secondary | ICD-10-CM | POA: Diagnosis not present

## 2016-05-11 DIAGNOSIS — Y999 Unspecified external cause status: Secondary | ICD-10-CM | POA: Diagnosis not present

## 2016-05-11 DIAGNOSIS — W25XXXA Contact with sharp glass, initial encounter: Secondary | ICD-10-CM | POA: Insufficient documentation

## 2016-05-11 DIAGNOSIS — Y92812 Truck as the place of occurrence of the external cause: Secondary | ICD-10-CM | POA: Insufficient documentation

## 2016-05-11 DIAGNOSIS — Z79899 Other long term (current) drug therapy: Secondary | ICD-10-CM | POA: Diagnosis not present

## 2016-05-11 DIAGNOSIS — S1081XA Abrasion of other specified part of neck, initial encounter: Secondary | ICD-10-CM

## 2016-05-11 DIAGNOSIS — Z87891 Personal history of nicotine dependence: Secondary | ICD-10-CM | POA: Insufficient documentation

## 2016-05-11 DIAGNOSIS — H5711 Ocular pain, right eye: Secondary | ICD-10-CM | POA: Diagnosis present

## 2016-05-11 MED ORDER — TETRACAINE HCL 0.5 % OP SOLN
2.0000 [drp] | Freq: Once | OPHTHALMIC | Status: AC
  Administered 2016-05-11: 2 [drp] via OPHTHALMIC
  Filled 2016-05-11: qty 4

## 2016-05-11 MED ORDER — CEPHALEXIN 500 MG PO CAPS: 1000.0000 mg | ORAL_CAPSULE | Freq: Two times a day (BID) | ORAL | Status: DC

## 2016-05-11 MED ORDER — FLUORESCEIN SODIUM 1 MG OP STRP
1.0000 | ORAL_STRIP | Freq: Once | OPHTHALMIC | Status: AC
  Administered 2016-05-11: 1 via OPHTHALMIC
  Filled 2016-05-11: qty 1

## 2016-05-11 NOTE — Discharge Instructions (Signed)
Cellulitis °Cellulitis is an infection of the skin and the tissue under the skin. The infected area is usually red and tender. This happens most often in the arms and lower legs. °HOME CARE  °· Take your antibiotic medicine as told. Finish the medicine even if you start to feel better. °· Keep the infected arm or leg raised (elevated). °· Put a warm cloth on the area up to 4 times per day. °· Only take medicines as told by your doctor. °· Keep all doctor visits as told. °GET HELP IF: °· You see red streaks on the skin coming from the infected area. °· Your red area gets bigger or turns a dark color. °· Your bone or joint under the infected area is painful after the skin heals. °· Your infection comes back in the same area or different area. °· You have a puffy (swollen) bump in the infected area. °· You have new symptoms. °· You have a fever. °GET HELP RIGHT AWAY IF:  °· You feel very sleepy. °· You throw up (vomit) or have watery poop (diarrhea). °· You feel sick and have muscle aches and pains. °  °This information is not intended to replace advice given to you by your health care provider. Make sure you discuss any questions you have with your health care provider. °  °Document Released: 04/16/2008 Document Revised: 07/20/2015 Document Reviewed: 01/14/2012 °Elsevier Interactive Patient Education ©2016 [employer] ° °

## 2016-05-11 NOTE — ED Provider Notes (Signed)
CSN: [id number]     Arrival date & time 05/11/16  1024 History   First MD Initiated Contact with Patient 05/11/16 1039     Chief Complaint  Patient presents with  . Eye Pain     (Consider location/radiation/quality/duration/timing/severity/associated sxs/prior Treatment) HPI   60 year old male with history of chronic dermatitis, hypertension, prediabetes presenting for evaluation of right eye discomfort. Patient complaining of a scratch adjacent to his right eye several days ago and since then he has noticed some soreness as well as swelling along the right upper eyelid. He attributed to potential glass fragment from a broken rear window from his truck several days back. Patient denies having any initial pain when the glass broke and did not see any flying fragment of glass. He denies any change in vision, pain in his eye, or eye redness. He denies any itchiness. No sneezing or coughing or increase tear production. He is up-to-date with tetanus. He does not wear any contact lenses. He did try taking some Benadryl thinking that this could be an allergic reaction. No improvement noted.  Past Medical History  Diagnosis Date  . UTI (urinary tract infection)     UTI 2008, saw urology; patient reports a cystoscopy (told ok)  . Elbow fracture, right     no surgery  . HYPERTENSION, BORDERLINE 03/15/2009  . Chronic dermatitis 12/02/2012  . Lower urinary tract symptoms (LUTS) 11/30/2014  . Prediabetes 11/30/2014   Past Surgical History  Procedure Laterality Date  . Knee arthroscopy w/ meniscal repair  remotely    Right   Family History  Problem Relation Age of Onset  . Breast cancer Mother   . Hypertension Mother   . Diabetes Mother     Jerilynn Mages, Brother   . Stroke Brother   . Colon cancer Neg Hx   . CAD Neg Hx   . Prostate cancer Other     Uncle   Social History  Substance Use Topics  . Smoking status: Former Smoker    Quit date: 11/12/2006  . Smokeless tobacco: Never Used  . Alcohol Use:  No    Review of Systems  Constitutional: Negative for fever.  Eyes: Negative for photophobia, pain, discharge, redness, itching and visual disturbance.  Skin: Positive for rash.      Allergies  Review of patient's allergies indicates no known allergies.  Home Medications   Prior to Admission medications   Medication Sig Start Date End Date Taking? Authorizing Provider  amLODipine (NORVASC) 5 MG tablet Take 1 tablet (5 mg total) by mouth daily. 03/31/15   Colon Branch, MD  aspirin 81 MG tablet Take 81 mg by mouth daily.    Historical Provider, MD  clobetasol ointment (TEMOVATE) 0.05 % Apply topically 2 (two) times daily as needed. 03/31/15   Colon Branch, MD   BP 136/92 mmHg  Pulse 83  Temp(Src) 98 F (36.7 C) (Oral)  Resp 16  Ht 5\' 9"  (1.753 m)  Wt 77.111 kg  BMI 25.09 kg/m2  SpO2 99% Physical Exam  Constitutional: He appears well-developed and well-nourished. No distress.  HENT:  Head: Atraumatic.  Eyes: Conjunctivae and EOM are normal. Pupils are equal, round, and reactive to light. Lids are everted and swept, no foreign bodies found. Right eye exhibits no chemosis, no discharge, no exudate and no hordeolum. No foreign body present in the right eye. No scleral icterus.  Slit lamp exam:      The right eye shows no corneal abrasion, no corneal flare, no  corneal ulcer, no foreign body, no hyphema, no hypopyon and no fluorescein uptake.  Skin abrasion noted to the lateral aspects of right orbital region with surrounding skin erythema, mild warmth and edema to the right upper eyelid. No abscess noted.  Neck: Neck supple.  Neurological: He is alert.  Skin: No rash noted.  Psychiatric: He has a normal mood and affect.  Nursing note and vitals reviewed.   ED Course  Procedures (including critical care time)   MDM   Final diagnoses:  Infected abrasion of face, initial encounter    BP 136/92 mmHg  Pulse 83  Temp(Src) 98 F (36.7 C) (Oral)  Resp 16  Ht 5\' 9"  (1.753 m)   Wt 77.111 kg  BMI 25.09 kg/m2  SpO2 99%   11:05 AM Patient here complaining of swelling to his right eyelid and pain around his right orbital region. He does have a small abrasion with surrounding skin cellulitis to the affected area at the lateral aspect of orbital region. No evidence of Chalazion or sty noted. Eye exam is unremarkable.  Normal visual acuity. No evidence of conjunctivitis. Low suspicion for orbital cellulitis. No foreign object noted. Plan to treat with Keflex, watchful waiting, and follow-up with eye specialist as needed.  Domenic Moras, PA-C 05/11/16 Stella, MD 05/11/16 [phone]

## 2016-05-11 NOTE — ED Notes (Signed)
Pt reports right eye swelling and soreness x 2 days. Denies any vision problems. No redness noted. Denies itching.

## 2016-06-01 ENCOUNTER — Other Ambulatory Visit: Payer: Self-pay | Admitting: Internal Medicine

## 2016-07-01 ENCOUNTER — Other Ambulatory Visit: Payer: Self-pay | Admitting: Internal Medicine

## 2016-07-10 ENCOUNTER — Other Ambulatory Visit: Payer: Self-pay | Admitting: Internal Medicine

## 2016-09-15 ENCOUNTER — Other Ambulatory Visit: Payer: Self-pay | Admitting: Internal Medicine

## 2016-11-16 DIAGNOSIS — Z0000 Encounter for general adult medical examination without abnormal findings: Secondary | ICD-10-CM | POA: Diagnosis not present

## 2016-11-16 DIAGNOSIS — Z125 Encounter for screening for malignant neoplasm of prostate: Secondary | ICD-10-CM | POA: Diagnosis not present

## 2017-12-09 DIAGNOSIS — R748 Abnormal levels of other serum enzymes: Secondary | ICD-10-CM | POA: Diagnosis not present

## 2018-09-03 ENCOUNTER — Encounter: Payer: Self-pay | Admitting: Gastroenterology

## 2018-12-19 DIAGNOSIS — I10 Essential (primary) hypertension: Secondary | ICD-10-CM | POA: Diagnosis not present

## 2018-12-19 DIAGNOSIS — Z125 Encounter for screening for malignant neoplasm of prostate: Secondary | ICD-10-CM | POA: Diagnosis not present

## 2018-12-19 DIAGNOSIS — Z0000 Encounter for general adult medical examination without abnormal findings: Secondary | ICD-10-CM | POA: Diagnosis not present

## 2019-02-04 DIAGNOSIS — N189 Chronic kidney disease, unspecified: Secondary | ICD-10-CM | POA: Diagnosis not present

## 2020-06-29 ENCOUNTER — Encounter: Payer: Self-pay | Admitting: Gastroenterology

## 2020-08-15 ENCOUNTER — Ambulatory Visit (AMBULATORY_SURGERY_CENTER): Payer: Self-pay | Admitting: *Deleted

## 2020-08-15 ENCOUNTER — Other Ambulatory Visit: Payer: Self-pay

## 2020-08-15 VITALS — Ht 69.0 in | Wt 184.0 lb

## 2020-08-15 DIAGNOSIS — Z86010 Personal history of colonic polyps: Secondary | ICD-10-CM

## 2020-08-15 MED ORDER — PLENVU 140 G PO SOLR: 1.0000 | Freq: Once | ORAL | 0 refills | Status: AC

## 2020-08-15 NOTE — Progress Notes (Signed)
Patient is here in-person for PV. Patient denies any allergies to eggs or soy. Patient denies any problems with anesthesia/sedation. Patient denies any oxygen use at home. Patient denies taking any diet/weight loss medications or blood thinners. Patient is not being treated for MRSA or C-diff. Patient is aware of our care-partner policy and Covid-19 safety protocol.    COVID-19 vaccines completed on 02/2020, per patient.   Prep Prescription coupon given to the patient. 

## 2020-08-16 ENCOUNTER — Encounter: Payer: Self-pay | Admitting: Gastroenterology

## 2020-08-30 ENCOUNTER — Other Ambulatory Visit: Payer: Self-pay

## 2020-08-30 ENCOUNTER — Encounter: Payer: Self-pay | Admitting: Gastroenterology

## 2020-08-30 ENCOUNTER — Ambulatory Visit (AMBULATORY_SURGERY_CENTER): Payer: Commercial Managed Care - PPO | Admitting: Gastroenterology

## 2020-08-30 VITALS — BP 124/86 | HR 96 | Temp 98.4°F | Resp 14 | Ht 69.0 in | Wt 184.0 lb

## 2020-08-30 DIAGNOSIS — D122 Benign neoplasm of ascending colon: Secondary | ICD-10-CM | POA: Diagnosis not present

## 2020-08-30 DIAGNOSIS — Z86010 Personal history of colonic polyps: Secondary | ICD-10-CM | POA: Diagnosis not present

## 2020-08-30 MED ORDER — SODIUM CHLORIDE 0.9 % IV SOLN: 500.0000 mL | INTRAVENOUS | Status: DC

## 2020-08-30 NOTE — Op Note (Signed)
[place] Patient Name: Steven Wood Procedure Date: 08/30/2020 7:55 AM MRN: [id number] Endoscopist: Milus Banister , MD Age: 64 Referring MD:  Date of Birth: Jul 08, 1956 Gender: Male Account #: [id number] Procedure:                Colonoscopy Indications:              High risk colon cancer surveillance: Personal                            history of colonic polyps; Colonoscopy 2014 two                            subCM polyps, one was a TA. Medicines:                Monitored Anesthesia Care Procedure:                Pre-Anesthesia Assessment:                           - Prior to the procedure, a History and Physical                            was performed, and patient medications and                            allergies were reviewed. The patient's tolerance of                            previous anesthesia was also reviewed. The risks                            and benefits of the procedure and the sedation                            options and risks were discussed with the patient.                            All questions were answered, and informed consent                            was obtained. Prior Anticoagulants: The patient has                            taken no previous anticoagulant or antiplatelet                            agents. ASA Grade Assessment: II - A patient with                            mild systemic disease. After reviewing the risks                            and benefits, the patient was deemed in  satisfactory condition to undergo the procedure.                           After obtaining informed consent, the colonoscope                            was passed under direct vision. Throughout the                            procedure, the patient's blood pressure, pulse, and                            oxygen saturations were monitored continuously. The                            Colonoscope was introduced through  the anus and                            advanced to the the cecum, identified by                            appendiceal orifice and ileocecal valve. The                            colonoscopy was performed without difficulty. The                            patient tolerated the procedure well. The quality                            of the bowel preparation was good. The ileocecal                            valve, appendiceal orifice, and rectum were                            photographed. Scope In: 8:01:34 AM Scope Out: 8:10:22 AM Scope Withdrawal Time: 0 hours 7 minutes 2 seconds  Total Procedure Duration: 0 hours 8 minutes 48 seconds  Findings:                 A 2 mm polyp was found in the ascending colon. The                            polyp was sessile. The polyp was removed with a                            cold snare. Resection and retrieval were complete.                           Multiple small and large-mouthed diverticula were                            found in the entire colon.  The exam was otherwise without abnormality on                            direct and retroflexion views. Complications:            No immediate complications. Estimated blood loss:                            None. Estimated Blood Loss:     Estimated blood loss: none. Impression:               - One 2 mm polyp in the ascending colon, removed                            with a cold snare. Resected and retrieved.                           - Diverticulosis in the entire examined colon.                           - The examination was otherwise normal on direct                            and retroflexion views. Recommendation:           - Patient has a contact number available for                            emergencies. The signs and symptoms of potential                            delayed complications were discussed with the                            patient. Return to normal  activities tomorrow.                            Written discharge instructions were provided to the                            patient.                           - Resume previous diet.                           - Continue present medications.                           - Await pathology results. Milus Banister, MD 08/30/2020 8:13:13 AM This report has been signed electronically.

## 2020-08-30 NOTE — Progress Notes (Signed)
Report to PACU, RN, vss, BBS= Clear.  

## 2020-08-30 NOTE — Patient Instructions (Signed)
Handouts on diverticulosis and polyps given to you today  Await pathology results from Dr. Ardis Hughs   YOU HAD AN ENDOSCOPIC PROCEDURE TODAY AT THE [facility]:   Refer to the procedure report that was given to you for any specific questions about what was found during the examination.  If the procedure report does not answer your questions, please call your gastroenterologist to clarify.  If you requested that your care partner not be given the details of your procedure findings, then the procedure report has been included in a sealed envelope for you to review at your convenience later.  YOU SHOULD EXPECT: Some feelings of bloating in the abdomen. Passage of more gas than usual.  Walking can help get rid of the air that was put into your GI tract during the procedure and reduce the bloating. If you had a lower endoscopy (such as a colonoscopy or flexible sigmoidoscopy) you may notice spotting of blood in your stool or on the toilet paper. If you underwent a bowel prep for your procedure, you may not have a normal bowel movement for a few days.  Please Note:  You might notice some irritation and congestion in your nose or some drainage.  This is from the oxygen used during your procedure.  There is no need for concern and it should clear up in a day or so.  SYMPTOMS TO REPORT IMMEDIATELY:   Following lower endoscopy (colonoscopy or flexible sigmoidoscopy):  Excessive amounts of blood in the stool  Significant tenderness or worsening of abdominal pains  Swelling of the abdomen that is new, acute  Fever of 100F or higher  For urgent or emergent issues, a gastroenterologist can be reached at any hour by calling [phone]. Do not use MyChart messaging for urgent concerns.    DIET:  We do recommend a small meal at first, but then you may proceed to your regular diet.  Drink plenty of fluids but you should avoid alcoholic beverages for 24 hours.  ACTIVITY:  You should plan to take  it easy for the rest of today and you should NOT DRIVE or use heavy machinery until tomorrow (because of the sedation medicines used during the test).    FOLLOW UP: Our staff will call the number listed on your records 48-72 hours following your procedure to check on you and address any questions or concerns that you may have regarding the information given to you following your procedure. If we do not reach you, we will leave a message.  We will attempt to reach you two times.  During this call, we will ask if you have developed any symptoms of COVID 19. If you develop any symptoms (ie: fever, flu-like symptoms, shortness of breath, cough etc.) before then, please call [phone].  If you test positive for Covid 19 in the 2 weeks post procedure, please call and report this information to [place].    If any biopsies were taken you will be contacted by phone or by letter within the next 1-3 weeks.  Please call us at [phone] if you have not heard about the biopsies in 3 weeks.    SIGNATURES/CONFIDENTIALITY: You and/or your care partner have signed paperwork which will be entered into your electronic medical record.  These signatures attest to the fact that that the information above on your After Visit Summary has been reviewed and is understood.  Full responsibility of the confidentiality of this discharge information lies with you and/or your care-partner.

## 2020-08-30 NOTE — Progress Notes (Signed)
Vital signs checked by:CW  The patient states no changes in medical or surgical history since pre-visit screening on 08/15/2020.

## 2020-08-30 NOTE — Progress Notes (Signed)
Called to room to assist during endoscopic procedure.  Patient ID and intended procedure confirmed with present staff. Received instructions for my participation in the procedure from the performing physician.  

## 2020-09-01 ENCOUNTER — Telehealth: Payer: Self-pay

## 2020-09-01 NOTE — Telephone Encounter (Signed)
  Follow up Call-  Call back number 08/30/2020  Post procedure Call Back phone  # [phone]  Permission to leave phone message Yes  Some recent data might be hidden     Patient questions:  Do you have a fever, pain , or abdominal swelling? No. Pain Score  0 *  Have you tolerated food without any problems? Yes.    Have you been able to return to your normal activities? Yes.    Do you have any questions about your discharge instructions: Diet   No. Medications  No. Follow up visit  No.  Do you have questions or concerns about your Care? No.  Actions: * If pain score is 4 or above: 1. No action needed, pain <4.Have you developed a fever since your procedure? no  2.   Have you had an respiratory symptoms (SOB or cough) since your procedure? no  3.   Have you tested positive for COVID 19 since your procedure no  4.   Have you had any family members/close contacts diagnosed with the COVID 19 since your procedure?  no   If yes to any of these questions please route to Joylene John, RN and Joella Prince, RN

## 2020-09-05 ENCOUNTER — Encounter: Payer: Self-pay | Admitting: Gastroenterology

## 2020-11-16 ENCOUNTER — Other Ambulatory Visit: Payer: Commercial Managed Care - PPO

## 2020-11-19 ENCOUNTER — Ambulatory Visit: Payer: Commercial Managed Care - PPO

## 2021-08-10 ENCOUNTER — Ambulatory Visit: Payer: Self-pay | Attending: Internal Medicine

## 2021-08-10 DIAGNOSIS — Z23 Encounter for immunization: Secondary | ICD-10-CM

## 2021-08-11 NOTE — Progress Notes (Signed)
   Covid-19 Vaccination Clinic  Name:  Steven Wood    MRN: [id number] DOB: 1956-09-25  08/11/2021  Mr. Steven Wood was observed post Covid-19 immunization for 15 minutes without incident. He was provided with Vaccine Information Sheet and instruction to access the V-Safe system.   Mr. Steven Wood was instructed to call 911 with any severe reactions post vaccine: Difficulty breathing  Swelling of face and throat  A fast heartbeat  A bad rash all over body  Dizziness and weakness

## 2021-08-21 ENCOUNTER — Other Ambulatory Visit (HOSPITAL_BASED_OUTPATIENT_CLINIC_OR_DEPARTMENT_OTHER): Payer: Self-pay

## 2021-08-21 MED ORDER — COVID-19MRNA BIVAL VACC PFIZER 30 MCG/0.3ML IM SUSP
INTRAMUSCULAR | 0 refills | Status: AC
  Filled 2021-08-21: qty 0.3, 1d supply, fill #0

## 2022-05-21 DIAGNOSIS — I10 Essential (primary) hypertension: Secondary | ICD-10-CM | POA: Diagnosis not present

## 2022-05-21 DIAGNOSIS — Z0000 Encounter for general adult medical examination without abnormal findings: Secondary | ICD-10-CM | POA: Diagnosis not present

## 2022-05-21 DIAGNOSIS — N529 Male erectile dysfunction, unspecified: Secondary | ICD-10-CM | POA: Diagnosis not present

## 2022-05-21 DIAGNOSIS — N1831 Chronic kidney disease, stage 3a: Secondary | ICD-10-CM | POA: Diagnosis not present

## 2022-05-21 DIAGNOSIS — Z125 Encounter for screening for malignant neoplasm of prostate: Secondary | ICD-10-CM | POA: Diagnosis not present

## 2022-05-21 DIAGNOSIS — Z13220 Encounter for screening for lipoid disorders: Secondary | ICD-10-CM | POA: Diagnosis not present

## 2023-02-04 ENCOUNTER — Ambulatory Visit
Admission: RE | Admit: 2023-02-04 | Discharge: 2023-02-04 | Disposition: A | Discharge status: Home / Self Care | Payer: BC Managed Care – PPO | Source: Ambulatory Visit | Attending: Internal Medicine | Admitting: Internal Medicine

## 2023-02-04 ENCOUNTER — Other Ambulatory Visit: Payer: Self-pay | Admitting: Internal Medicine

## 2023-02-04 DIAGNOSIS — M25569 Pain in unspecified knee: Secondary | ICD-10-CM | POA: Diagnosis not present

## 2023-02-04 DIAGNOSIS — M25562 Pain in left knee: Secondary | ICD-10-CM

## 2023-02-04 DIAGNOSIS — M1711 Unilateral primary osteoarthritis, right knee: Secondary | ICD-10-CM | POA: Diagnosis not present

## 2023-05-04 ENCOUNTER — Other Ambulatory Visit: Payer: Self-pay

## 2023-05-04 ENCOUNTER — Encounter (HOSPITAL_BASED_OUTPATIENT_CLINIC_OR_DEPARTMENT_OTHER): Payer: Self-pay | Admitting: Emergency Medicine

## 2023-05-04 ENCOUNTER — Emergency Department (HOSPITAL_BASED_OUTPATIENT_CLINIC_OR_DEPARTMENT_OTHER): Payer: BC Managed Care – PPO

## 2023-05-04 ENCOUNTER — Emergency Department (HOSPITAL_BASED_OUTPATIENT_CLINIC_OR_DEPARTMENT_OTHER)
Admission: EM | Admit: 2023-05-04 | Discharge: 2023-05-04 | Disposition: A | Discharge status: Home / Self Care | Payer: BC Managed Care – PPO | Attending: Emergency Medicine | Admitting: Emergency Medicine

## 2023-05-04 DIAGNOSIS — Z79899 Other long term (current) drug therapy: Secondary | ICD-10-CM | POA: Insufficient documentation

## 2023-05-04 DIAGNOSIS — R197 Diarrhea, unspecified: Secondary | ICD-10-CM | POA: Diagnosis not present

## 2023-05-04 DIAGNOSIS — D72819 Decreased white blood cell count, unspecified: Secondary | ICD-10-CM | POA: Insufficient documentation

## 2023-05-04 DIAGNOSIS — Z7982 Long term (current) use of aspirin: Secondary | ICD-10-CM | POA: Diagnosis not present

## 2023-05-04 DIAGNOSIS — I10 Essential (primary) hypertension: Secondary | ICD-10-CM | POA: Insufficient documentation

## 2023-05-04 DIAGNOSIS — R109 Unspecified abdominal pain: Secondary | ICD-10-CM | POA: Diagnosis not present

## 2023-05-04 DIAGNOSIS — K529 Noninfective gastroenteritis and colitis, unspecified: Secondary | ICD-10-CM | POA: Insufficient documentation

## 2023-05-04 DIAGNOSIS — N200 Calculus of kidney: Secondary | ICD-10-CM | POA: Diagnosis not present

## 2023-05-04 DIAGNOSIS — N281 Cyst of kidney, acquired: Secondary | ICD-10-CM | POA: Diagnosis not present

## 2023-05-04 DIAGNOSIS — K5730 Diverticulosis of large intestine without perforation or abscess without bleeding: Secondary | ICD-10-CM | POA: Diagnosis not present

## 2023-05-04 LAB — COMPREHENSIVE METABOLIC PANEL
ALT: 30 U/L (ref 0–44)
AST: 25 U/L (ref 15–41)
Albumin: 3.6 g/dL (ref 3.5–5.0)
Alkaline Phosphatase: 53 U/L (ref 38–126)
Anion gap: 10 (ref 5–15)
BUN: 10 mg/dL (ref 8–23)
CO2: 24 mmol/L (ref 22–32)
Calcium: 8.7 mg/dL — ABNORMAL LOW (ref 8.9–10.3)
Chloride: 102 mmol/L (ref 98–111)
Creatinine, Ser: 1.57 mg/dL — ABNORMAL HIGH (ref 0.61–1.24)
GFR, Estimated: 48 mL/min — ABNORMAL LOW (ref >60)
Glucose, Bld: 131 mg/dL — ABNORMAL HIGH (ref 70–99)
Potassium: 3 mmol/L — ABNORMAL LOW (ref 3.5–5.1)
Sodium: 136 mmol/L (ref 135–145)
Total Bilirubin: 0.6 mg/dL (ref 0.3–1.2)
Total Protein: 7.5 g/dL (ref 6.5–8.1)

## 2023-05-04 LAB — URINALYSIS, ROUTINE W REFLEX MICROSCOPIC
Glucose, UA: NEGATIVE mg/dL
Ketones, ur: NEGATIVE mg/dL
Leukocytes,Ua: NEGATIVE
Nitrite: NEGATIVE
Protein, ur: 100 mg/dL — AB
Specific Gravity, Urine: 1.025 (ref 1.005–1.030)
pH: 6 (ref 5.0–8.0)

## 2023-05-04 LAB — CBC
HCT: 45.8 % (ref 39.0–52.0)
Hemoglobin: 15.7 g/dL (ref 13.0–17.0)
MCH: 27.8 pg (ref 26.0–34.0)
MCHC: 34.3 g/dL (ref 30.0–36.0)
MCV: 81.2 fL (ref 80.0–100.0)
Platelets: 260 10*3/uL (ref 150–400)
RBC: 5.64 MIL/uL (ref 4.22–5.81)
RDW: 12.3 % (ref 11.5–15.5)
WBC: 3.5 10*3/uL — ABNORMAL LOW (ref 4.0–10.5)
nRBC: 0 % (ref 0.0–0.2)

## 2023-05-04 LAB — URINALYSIS, MICROSCOPIC (REFLEX): RBC / HPF: NONE SEEN RBC/hpf (ref 0–5)

## 2023-05-04 LAB — LIPASE, BLOOD: Lipase: 32 U/L (ref 11–51)

## 2023-05-04 MED ORDER — IOHEXOL 300 MG/ML  SOLN
80.0000 mL | Freq: Once | INTRAMUSCULAR | Status: AC | PRN
  Administered 2023-05-04: 80 mL via INTRAVENOUS

## 2023-05-04 MED ORDER — POTASSIUM CHLORIDE CRYS ER 20 MEQ PO TBCR
40.0000 meq | EXTENDED_RELEASE_TABLET | Freq: Once | ORAL | Status: AC
  Administered 2023-05-04: 40 meq via ORAL
  Filled 2023-05-04: qty 2

## 2023-05-04 NOTE — ED Triage Notes (Signed)
Diarrhea x 4 days. Denies abd pain

## 2023-05-04 NOTE — Discharge Instructions (Addendum)
You were evaluated today for diarrhea.  Your CT scan shows results consistent with colitis.  Please follow instructions attached for dietary options.  Please consider buying Imodium A-D over-the-counter to help with your diarrhea.  Please follow-up with gastroenterology and primary care for further evaluation and management.  I have attached a copy of CT scan results below.  If you develop any life-threatening symptoms please return to the emergency department.  1. Ascending and transverse colonic wall thickening suggesting  mild-to-moderate colitis.  2. Colonic diverticulosis without evidence of acute diverticulitis.  3. Normal appendix.  4. 7 x 4 mm nonobstructing calculus in the lower pole of the right  kidney. No evidence of hydronephrosis or ureteral calculus.  5. 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    Aortic Atherosclerosis

## 2023-05-04 NOTE — ED Provider Notes (Signed)
[facility] Provider Note   CSN: [id number] Arrival date & time: 05/04/23  1018     History  Chief Complaint  Patient presents with   Diarrhea    Steven Wood is a 67 y.o. male.  Patient presents to the emergency department complaining of 4 days of diarrhea.  He denies nausea, vomiting, abdominal pain, chest pain, shortness of breath, fevers.  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  Family at bedside is concerned that he may have a foodborne illness.  He denies any blood in the diarrhea.  He states he had very frequent episodes of diarrhea on the first day.  He states that yesterday he had 4 episodes, very light in color, with no blood.  Past medical history significant for hypertension, GERD, prediabetes  HPI     Home Medications Prior to Admission medications   Medication Sig Start Date End Date Taking? Authorizing Provider  amLODipine (NORVASC) 10 MG tablet Take 10 mg by mouth daily. 08/14/20   [provider]  aspirin 81 MG tablet Take 81 mg by mouth daily.    [provider]  clobetasol ointment (TEMOVATE) 0.05 % Apply topically 2 (two) times daily as needed. 03/31/15   Wanda Plump, MD  COVID-19 mRNA bivalent vaccine, Pfizer, injection Inject into the muscle. 08/10/21   Judyann Munson, MD  hydrochlorothiazide (HYDRODIURIL) 25 MG tablet Take 50 mg by mouth daily.    [provider]  omeprazole (PRILOSEC) 20 MG capsule Take 1 capsule by mouth daily as needed. 12/26/18   [provider]      Allergies    Patient has no known allergies.    Review of Systems   Review of Systems  Physical Exam Updated Vital Signs BP 128/82   Pulse 78   Temp 97.7 F (36.5 C) (Oral)   Resp 16   Ht 5' 8.5" (1.74 m)   Wt 77.6 kg   SpO2 95%   BMI 25.62 kg/m  Physical Exam Vitals and nursing note reviewed.  Constitutional:      General: He is not in acute distress.     Appearance: He is well-developed.  HENT:     Head: Normocephalic and atraumatic.     Mouth/Throat:     Mouth: Mucous membranes are moist.  Eyes:     Conjunctiva/sclera: Conjunctivae normal.  Cardiovascular:     Rate and Rhythm: Normal rate and regular rhythm.     Heart sounds: No murmur heard. Pulmonary:     Effort: Pulmonary effort is normal. No respiratory distress.     Breath sounds: Normal breath sounds.  Abdominal:     Palpations: Abdomen is soft.     Tenderness: There is no abdominal tenderness.  Musculoskeletal:        General: No swelling.     Cervical back: Neck supple.  Skin:    General: Skin is warm and dry.     Capillary Refill: Capillary refill takes less than 2 seconds.  Neurological:     Mental Status: He is alert.  Psychiatric:        Mood and Affect: Mood normal.     ED Results / Procedures / Treatments   Labs (all labs ordered are listed, but only abnormal results are displayed) Labs Reviewed  COMPREHENSIVE METABOLIC PANEL - Abnormal; Notable for the following components:      Result Value   Potassium 3.0 (*)  Glucose, Bld 131 (*)    Creatinine, Ser 1.57 (*)    Calcium 8.7 (*)    GFR, Estimated 48 (*)    All other components within normal limits  CBC - Abnormal; Notable for the following components:   WBC 3.5 (*)    All other components within normal limits  URINALYSIS, ROUTINE W REFLEX MICROSCOPIC - Abnormal; Notable for the following components:   Hgb urine dipstick SMALL (*)    Bilirubin Urine SMALL (*)    Protein, ur 100 (*)    All other components within normal limits  URINALYSIS, MICROSCOPIC (REFLEX) - Abnormal; Notable for the following components:   Bacteria, UA FEW (*)    All other components within normal limits  GASTROINTESTINAL PANEL BY PCR, STOOL (REPLACES STOOL CULTURE)  C DIFFICILE QUICK SCREEN W PCR REFLEX    LIPASE, BLOOD    EKG None  Radiology CT ABDOMEN PELVIS W CONTRAST  Result Date: 05/04/2023 CLINICAL DATA:   Acute abdominal pain.  Diarrhea. EXAM: CT ABDOMEN AND PELVIS WITH CONTRAST TECHNIQUE: Multidetector CT imaging of the abdomen and pelvis was performed using the standard protocol following bolus administration of intravenous contrast. RADIATION DOSE REDUCTION: This exam was performed according to the departmental dose-optimization program which includes automated exposure control, adjustment of the mA and/or kV according to patient size and/or use of iterative reconstruction technique. CONTRAST:  80mL OMNIPAQUE IOHEXOL 300 MG/ML  SOLN COMPARISON:  CT examination dated Mar 21, 2007 FINDINGS: Lower chest: No acute abnormality. Hepatobiliary: No focal liver abnormality is seen. No gallstones, gallbladder wall thickening, or biliary dilatation. Pancreas: Unremarkable. No pancreatic ductal dilatation or surrounding inflammatory changes. Spleen: Normal in size without focal abnormality. Adrenals/Urinary Tract: Adrenal glands are unremarkable. 7 x 4 mm calculus in the lower pole of the right kidney. Exophytic 1 cm cyst in the lower pole of the right kidney. No evidence of hydronephrosis or ureteral calculus. Bladder is unremarkable. Stomach/Bowel: Stomach is within normal limits. Appendix appears normal. There is ascending and transverse colonic wall thickening suggesting mild-to-moderate colitis. Colonic diverticulosis prominent in the sigmoid colon without evidence of acute diverticulitis. No extraluminal free air or abdominal collection. Vascular/Lymphatic: Aortic atherosclerosis. No enlarged abdominal or pelvic lymph nodes. Reproductive: Prostate is unremarkable. Other: No abdominal wall hernia or abnormality. No abdominopelvic ascites. Musculoskeletal: Degenerate disc disease of the lumbar spine prominent at L5-S1 with associated facet joint arthropathy. No acute osseous abnormality. IMPRESSION: 1. Ascending and transverse colonic wall thickening suggesting mild-to-moderate colitis. 2. Colonic diverticulosis without  evidence of acute diverticulitis. 3. Normal appendix. 4. 7 x 4 mm nonobstructing calculus in the lower pole of the right kidney. No evidence of hydronephrosis or ureteral calculus. 5. Degenerate disc disease of the lumbar spine prominent at L5-S1 with associated facet joint arthropathy. Aortic Atherosclerosis (ICD10-I70.0). Electronically Signed   By: Larose Hires D.O.   On: 05/04/2023 12:33    Procedures Procedures    Medications Ordered in ED Medications  potassium chloride SA (KLOR-CON M) CR tablet 40 mEq (has no administration in time range)  iohexol (OMNIPAQUE) 300 MG/ML solution 80 mL (80 mLs Intravenous Contrast Given 05/04/23 1214)    ED Course/ Medical Decision Making/ A&P                             Medical Decision Making Amount and/or Complexity of Data Reviewed Labs: ordered. Radiology: ordered.  Risk Prescription drug management.   This patient presents to the ED for concern  of diarrhea, this involves an extensive number of treatment options, and is a complaint that carries with it a high risk of complications and morbidity.  The differential diagnosis includes infectious process, foodborne illness, cholecystitis, appendicitis, others   Co morbidities that complicate the patient evaluation  Hypertension   Additional history obtained:  Additional history obtained from family at bedside  Lab Tests:  I Ordered, and personally interpreted labs.  The pertinent results include: Mildly elevated creatinine at 1.57 (baseline appears to be 1.4), potassium 3.0, WBC 3.5, UA with protein, small bilirubin, small hemoglobin, few bacteria   Imaging Studies ordered:  I ordered imaging studies including CT abdomen pelvis with contrast I independently visualized and interpreted imaging which showed  1. Ascending and transverse colonic wall thickening suggesting  mild-to-moderate colitis.  2. Colonic diverticulosis without evidence of acute diverticulitis.  3. Normal  appendix.  4. 7 x 4 mm nonobstructing calculus in the lower pole of the right  kidney. No evidence of hydronephrosis or ureteral calculus.  5. 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    Aortic Atherosclerosis   I agree with the radiologist interpretation   Problem List / ED Course / Critical interventions / Medication management   I ordered medication including potassium for hypokalemia Reevaluation of the patient after these medicines showed that the patient stayed the same I have reviewed the patients home medicines and have made adjustments as needed   Social Determinants of Health:  Patient works as a Ambulance person / Admission - Considered:  Patient has no abdominal pain at this time.  CT scanning showing possible mild to moderate colitis, no other acute findings on CT scan.  No signs of pancreatitis, cholecystitis, appendicitis.  Lab work is grossly unremarkable other than mild hypokalemia.  Patient states he has potassium supplementation at home that he may begin to take.  Plan to discharge home with diet instructions, recommendations for Imodium, and recommendations for follow-up with gastroenterology and primary care for further evaluation and management.  Patient did attempt to provide a stool sample today but was unable to provide 1 and states this is an improvement over the past few days when he was having diarrhea almost every time he would to urinate.  Vitals are normal.  Patient is not septic         Final Clinical Impression(s) / ED Diagnoses Final diagnoses:  Colitis  Diarrhea, unspecified type    Rx / DC Orders ED Discharge Orders     None         Pamala Duffel 05/04/23 1349    Tanda Rockers A, DO 05/04/23 1617

## 2023-06-19 DIAGNOSIS — Z0000 Encounter for general adult medical examination without abnormal findings: Secondary | ICD-10-CM | POA: Diagnosis not present

## 2023-06-19 DIAGNOSIS — N529 Male erectile dysfunction, unspecified: Secondary | ICD-10-CM | POA: Diagnosis not present

## 2023-06-19 DIAGNOSIS — N1831 Chronic kidney disease, stage 3a: Secondary | ICD-10-CM | POA: Diagnosis not present

## 2023-06-19 DIAGNOSIS — Z13220 Encounter for screening for lipoid disorders: Secondary | ICD-10-CM | POA: Diagnosis not present

## 2023-06-19 DIAGNOSIS — Z125 Encounter for screening for malignant neoplasm of prostate: Secondary | ICD-10-CM | POA: Diagnosis not present

## 2023-06-19 DIAGNOSIS — I10 Essential (primary) hypertension: Secondary | ICD-10-CM | POA: Diagnosis not present

## 2023-07-16 DIAGNOSIS — D649 Anemia, unspecified: Secondary | ICD-10-CM | POA: Diagnosis not present

## 2023-11-12 DIAGNOSIS — M545 Low back pain, unspecified: Secondary | ICD-10-CM | POA: Diagnosis not present

## 2023-11-29 DIAGNOSIS — R110 Nausea: Secondary | ICD-10-CM | POA: Diagnosis not present

## 2023-11-29 DIAGNOSIS — M545 Low back pain, unspecified: Secondary | ICD-10-CM | POA: Diagnosis not present

## 2023-12-25 ENCOUNTER — Ambulatory Visit: Payer: BC Managed Care – PPO | Admitting: Physical Medicine and Rehabilitation

## 2023-12-25 ENCOUNTER — Telehealth: Payer: Self-pay | Admitting: Physical Medicine and Rehabilitation

## 2023-12-25 ENCOUNTER — Encounter: Payer: Self-pay | Admitting: Physical Medicine and Rehabilitation

## 2023-12-25 VITALS — BP 156/94 | HR 92

## 2023-12-25 DIAGNOSIS — M4316 Spondylolisthesis, lumbar region: Secondary | ICD-10-CM

## 2023-12-25 DIAGNOSIS — G8929 Other chronic pain: Secondary | ICD-10-CM

## 2023-12-25 DIAGNOSIS — M5416 Radiculopathy, lumbar region: Secondary | ICD-10-CM | POA: Diagnosis not present

## 2023-12-25 DIAGNOSIS — M5442 Lumbago with sciatica, left side: Secondary | ICD-10-CM

## 2023-12-25 DIAGNOSIS — M47816 Spondylosis without myelopathy or radiculopathy, lumbar region: Secondary | ICD-10-CM | POA: Diagnosis not present

## 2023-12-25 NOTE — Progress Notes (Unsigned)
Steven Wood - 68 y.o. male MRN [id number]  Date of birth: Oct 29, 1956  Office Visit Note: Visit Date: 12/25/2023 PCP: Renford Dills, MD Referred by: Renford Dills, MD  Subjective: Chief Complaint  Patient presents with   Lower Back - Pain   HPI: Steven Wood is a 68 y.o. male who comes in today as a self referral for evaluation of chronic, worsening and severe bilateral lower back pain radiating to buttocks, intermittent radiation of pain down left lateral leg to foot. Pain ongoing for several months. His pain worsens when moving from sitting to standing position, he describes pain as sore and aching sensation, currently rates as 9 out of 10. Pain radiating down the leg feels like a burning sensation.Some relief of pain with home exercise regimen, rest and use of medications. He does take Aleve with some relief of pain. He did receive Toradol and Depo-Medrol with his PCP Dr. Nehemiah Settle that did provide short term relief of pain. No history of formal physical therapy. Recent lumbar radiographs show grade 1 anterolisthesis of L4 on L5 and lower lumbar facet arthropathy. No prior lumbar MRI imaging. Patient currently working full time as Naval architect. Patient denies focal weakness, numbness and tingling. No recent trauma or falls.      Review of Systems  Musculoskeletal:  Positive for back pain.  Neurological:  Negative for tingling, sensory change, focal weakness and weakness.  All other systems reviewed and are negative.  Otherwise per HPI.  Assessment & Plan: Visit Diagnoses:    ICD-10-CM   1. Chronic bilateral low back pain with left-sided sciatica  M54.42 MR LUMBAR SPINE WO CONTRAST   G89.29     2. Lumbar radiculopathy  M54.16 MR LUMBAR SPINE WO CONTRAST    3. Anterolisthesis of lumbar spine  M43.16 MR LUMBAR SPINE WO CONTRAST    4. Facet arthropathy, lumbar  M47.816 MR LUMBAR SPINE WO CONTRAST       Plan: Findings:  Chronic, worsening and severe bilateral lower back pain,  intermittent radiation of pain down left lateral leg to foot. Lower back pain seems to be worse on the left. Patient continues to have severe pain despite good conservative therapies such as home exercise regimen, rest and use of medications. Patients clinical presentation and exam are consistent with L5 nerve pattern. There is facet arthropathy and grade 1 anterolisthesis at the level of L4-L5 on recent x-rays. We discussed treatment plan in detail today, next step is to place order for lumbar MRI imaging. Depending on results of MRI imaging we discussed the possibility of performing lumbar epidural steroid injections. I instructed patient to continue with Aleve/Tylenol as needed. I will have patient follow up for lumbar MRI review. I encouraged him to remain active as tolerated. No red flag symptoms noted upon exam today.     Meds & Orders: No orders of the defined types were placed in this encounter.   Orders Placed This Encounter  Procedures   MR LUMBAR SPINE WO CONTRAST    Follow-up: Return for Lumbar MRI review.   Procedures: No procedures performed      Clinical History: CLINICAL DATA: Low back pain  EXAM: LUMBAR SPINE - 2-3 VIEW  COMPARISON: None Available.  FINDINGS: Five lumbar type vertebral bodies. There is no evidence of lumbar spine fracture. Grade 1 anterolisthesis of L4 on L5. Disc height loss at L5-S1, with osteophyte formation. Lower lumbar facet arthropathy.  IMPRESSION: Lower lumbar degenerative disc disease and facet arthropathy.   Electronically Signed By: Jill Side  Vasan M.D. On: 11/29/2023 12:44   He reports that he quit smoking about 17 years ago. He has never used smokeless tobacco. No results for input(s): "HGBA1C", "LABURIC" in the last 8760 hours.  Objective:  VS:  HT:    WT:   BMI:     BP:(!) 156/94  HR:92bpm  TEMP: ( )  RESP:  Physical Exam Vitals and nursing note reviewed.  HENT:     Head: Normocephalic and atraumatic.     Right Ear:  External ear normal.     Left Ear: External ear normal.     Nose: Nose normal.     Mouth/Throat:     Mouth: Mucous membranes are moist.  Eyes:     Extraocular Movements: Extraocular movements intact.  Cardiovascular:     Rate and Rhythm: Normal rate.     Pulses: Normal pulses.  Pulmonary:     Effort: Pulmonary effort is normal.  Abdominal:     General: Abdomen is flat. There is no distension.  Musculoskeletal:        General: Tenderness present.     Cervical back: Normal range of motion.     Comments: Patient rises from seated position to standing without difficulty. Good lumbar range of motion. No pain noted with facet loading. 5/5 strength noted with bilateral hip flexion, knee flexion/extension, ankle dorsiflexion/plantarflexion and EHL. No clonus noted bilaterally. No pain upon palpation of greater trochanters. No pain with internal/external rotation of bilateral hips. Sensation intact bilaterally. Dysesthesias noted to left L5 dermatome. Negative slump test bilaterally. Ambulates without aid, gait steady.     Skin:    General: Skin is warm and dry.     Capillary Refill: Capillary refill takes less than 2 seconds.  Neurological:     General: No focal deficit present.     Mental Status: He is alert and oriented to person, place, and time.  Psychiatric:        Mood and Affect: Mood normal.        Behavior: Behavior normal.     Ortho Exam  Imaging: No results found.  Past Medical/Family/Surgical/Social History: Medications & Allergies reviewed per EMR, new medications updated. Patient Active Problem List   Diagnosis Date Noted   Prediabetes 11/30/2014   Lower urinary tract symptoms (LUTS) 11/30/2014   Borderline abnormal TFTs 02/16/2014   Chronic dermatitis 12/02/2012   Annual physical exam 07/03/2011   Essential hypertension 03/15/2009   Past Medical History:  Diagnosis Date   Chronic dermatitis 12/02/2012   Elbow fracture, right    no surgery   GERD  (gastroesophageal reflux disease)    HYPERTENSION, BORDERLINE 03/15/2009   Lower urinary tract symptoms (LUTS) 11/30/2014   Prediabetes 11/30/2014   UTI (urinary tract infection)    UTI 2008, saw urology; patient reports a cystoscopy (told ok)   Family History  Problem Relation Age of Onset   Breast cancer Mother    Hypertension Mother    Diabetes Mother        M, Brother    Colon polyps Mother    Stroke Brother    Prostate cancer Other        Uncle   CAD Neg Hx    Esophageal cancer Neg Hx    Rectal cancer Neg Hx    Stomach cancer Neg Hx    Colon cancer Neg Hx    Past Surgical History:  Procedure Laterality Date   COLONOSCOPY  07/14/2013   Christella Hartigan   KNEE ARTHROSCOPY W/ MENISCAL REPAIR  remotely   Right   POLYPECTOMY     Social History   Occupational History   Occupation: deliver , truck driver  Tobacco Use   Smoking status: Former    Current packs/day: 0.00    Types: Cigarettes    Quit date: 11/12/2006    Years since quitting: 17.1   Smokeless tobacco: Never  Vaping Use   Vaping status: Never Used  Substance and Sexual Activity   Alcohol use: No   Drug use: No   Sexual activity: Not on file

## 2023-12-25 NOTE — Telephone Encounter (Signed)
Patient was seen this morning by [facility]. She told him to let her know when MRI was scheduled. He called stating it is scheduled for February 27th.

## 2023-12-25 NOTE — Progress Notes (Unsigned)
Pain Scale--9 No Blood Thinners No allergies to Contrast Dye

## 2024-01-03 ENCOUNTER — Encounter: Payer: Self-pay | Admitting: Physical Medicine and Rehabilitation

## 2024-01-09 ENCOUNTER — Ambulatory Visit
Admission: RE | Admit: 2024-01-09 | Discharge: 2024-01-09 | Disposition: A | Discharge status: Home / Self Care | Payer: BC Managed Care – PPO | Source: Ambulatory Visit | Attending: Physical Medicine and Rehabilitation | Admitting: Physical Medicine and Rehabilitation

## 2024-01-09 DIAGNOSIS — G8929 Other chronic pain: Secondary | ICD-10-CM

## 2024-01-09 DIAGNOSIS — M5416 Radiculopathy, lumbar region: Secondary | ICD-10-CM

## 2024-01-09 DIAGNOSIS — M4316 Spondylolisthesis, lumbar region: Secondary | ICD-10-CM

## 2024-01-09 DIAGNOSIS — M5126 Other intervertebral disc displacement, lumbar region: Secondary | ICD-10-CM | POA: Diagnosis not present

## 2024-01-09 DIAGNOSIS — M47816 Spondylosis without myelopathy or radiculopathy, lumbar region: Secondary | ICD-10-CM

## 2024-01-16 ENCOUNTER — Encounter: Payer: Self-pay | Admitting: Physical Medicine and Rehabilitation

## 2024-01-16 ENCOUNTER — Ambulatory Visit: Payer: BC Managed Care – PPO | Admitting: Physical Medicine and Rehabilitation

## 2024-01-16 VITALS — BP 168/98 | HR 76

## 2024-01-16 DIAGNOSIS — M47816 Spondylosis without myelopathy or radiculopathy, lumbar region: Secondary | ICD-10-CM

## 2024-01-16 DIAGNOSIS — M47819 Spondylosis without myelopathy or radiculopathy, site unspecified: Secondary | ICD-10-CM | POA: Diagnosis not present

## 2024-01-16 DIAGNOSIS — M545 Low back pain, unspecified: Secondary | ICD-10-CM | POA: Diagnosis not present

## 2024-01-16 DIAGNOSIS — G8929 Other chronic pain: Secondary | ICD-10-CM

## 2024-01-16 NOTE — Progress Notes (Signed)
 Pain Scale   Average Pain 5        +Driver, -BT, -Dye Allergies.

## 2024-01-16 NOTE — Patient Instructions (Signed)

## 2024-01-16 NOTE — Progress Notes (Signed)
 Steven Wood - 68 y.o. male MRN [id number]  Date of birth: [birth year]  Office Visit Note: Visit Date: 01/16/2024 PCP: Renford Dills, MD Referred by: Renford Dills, MD  Subjective: Chief Complaint  Patient presents with   Lower Back - Pain   HPI: Steven Wood is a 68 y.o. male who comes in today for evaluation of chronic bilateral lower back pain radiating to buttocks, intermittent radiation of pain down left lateral leg to foot. Pain ongoing for several months. He reports his pain has significantly subsided, he is no longer experiencing left leg symptoms. Does reports intermittent left sided lower back pain that does improve with Aleve. His pain worsens when moving from sitting to standing position. Some relief of pain with home exercise regimen, rest and use of medications. Recent lumbar MRI imaging shows grade 1 anterolisthesis at L4-L5, there is moderate facet hypertrophy at L4-L5, worse left than right. No high grade spinal canal stenosis noted. Patient currently working full time as [employer]. Patient denies focal weakness, numbness and tingling. No recent trauma or falls.      Review of Systems  Musculoskeletal:  Positive for back pain.  Neurological:  Negative for tingling, sensory change, focal weakness and weakness.  All other systems reviewed and are negative.  Otherwise per HPI.  Assessment & Plan: Visit Diagnoses:    ICD-10-CM   1. Chronic bilateral low back pain without sciatica  M54.50    G89.29     2. Spondylosis without myelopathy or radiculopathy  M47.819     3. Facet arthropathy, lumbar  M47.816        Plan: Findings:  Chronic bilateral lower back pain radiating to buttocks, intermittent radiation of pain down left lateral leg to foot.  His pain has subsided significantly since our last office visit.  He is no longer experiencing left-sided radicular symptoms and his left lower back pain seems to be more intermittent and manageable at home.  I discussed  recent lumbar MRI using imaging and spine model today.  Patient's clinical presentation and exam do seem to be more consistent with facet mediated pain.  There is moderate facet arthropathy at the level of L4-L5, left greater than right.  At this point, would recommend short course of physical therapy with a focus on core strengthening.  Patient would prefer to complete physical therapy at home as his driving schedule does not allow him to make it to appointments during the week.  I did provide him with Dr. Kellie Simmering "Big [employer]" exercises to perform at home.  Should his pain return we would consider performing left L4-L5 facet/medial branch block with the possibility of longer sustained pain relief with radiofrequency ablation.  Patient has no questions at this time.  I encouraged him to follow-up with [place] as needed.  No red flag symptoms noted upon exam today.    Meds & Orders: No orders of the defined types were placed in this encounter.  No orders of the defined types were placed in this encounter.   Follow-up: Return if symptoms worsen or fail to improve.   Procedures: No procedures performed      Clinical History: Narrative & Impression CLINICAL DATA:  Low back pain. Left leg pain. Symptoms for 6 months.   EXAM: MRI LUMBAR SPINE WITHOUT CONTRAST   TECHNIQUE: Multiplanar, multisequence MR imaging of the lumbar spine was performed. No intravenous contrast was administered.   COMPARISON:  CT abdomen pelvis 05/04/2023.   FINDINGS: Segmentation: 5 non rib-bearing lumbar type  vertebral bodies are present. The lowest fully formed vertebral body is L5.   Alignment: Slight degenerative anterolisthesis present at L4-5. No other significant listhesis is present. Mild straightening of the normal lumbar lordosis is present.   Vertebrae: Type 2 Modic changes are present anteriorly at L5-S1. Marrow signal and vertebral body heights are otherwise normal.   Conus medullaris and cauda  equina: Conus extends to the L1-2 level. Conus and cauda equina appear normal.   Paraspinal and other soft tissues: Limited imaging the abdomen is unremarkable. There is no significant adenopathy. No solid organ lesions are present.   Disc levels:   The disc levels at L2-3 and above are normal.   L3-4: Mild disc bulging and facet hypertrophy is present. No focal stenosis is present.   L4-5: Mild uncovering of the disc is present at L4-5 associated with the anterolisthesis. Moderate facet hypertrophy is worse left than right. No focal stenosis is present.   L5-S1: Chronic loss of disc height is present. A shallow right paramedian disc protrusion and annular tear is present without focal stenosis.   IMPRESSION: 1. Mild disc bulging and facet hypertrophy at L3-4 without focal stenosis. 2. Moderate facet hypertrophy at L4-5 is worse left than right. This results in slight degenerative anterolisthesis at L4-5. 3. Shallow right paramedian disc protrusion and annular tear at L5-S1 without focal stenosis.     Electronically Signed   By: Marin Roberts M.D.   On: 01/16/2024 10:28   He reports that he quit smoking about 17 years ago. He has never used smokeless tobacco. No results for input(s): "HGBA1C", "LABURIC" in the last 8760 hours.  Objective:  VS:  HT:    WT:   BMI:     BP:(!) 168/98  HR:76bpm  TEMP: ( )  RESP:  Physical Exam Vitals and nursing note reviewed.  HENT:     Head: Normocephalic and atraumatic.     Right Ear: External ear normal.     Left Ear: External ear normal.     Nose: Nose normal.     Mouth/Throat:     Mouth: Mucous membranes are moist.  Eyes:     Extraocular Movements: Extraocular movements intact.  Cardiovascular:     Rate and Rhythm: Normal rate.     Pulses: Normal pulses.  Pulmonary:     Effort: Pulmonary effort is normal.  Abdominal:     General: Abdomen is flat. There is no distension.  Musculoskeletal:        General:  Tenderness present.     Cervical back: Normal range of motion.     Comments: Patient rises from seated position to standing without difficulty. Pain noted with lumbar extension. 5/5 strength noted with bilateral hip flexion, knee flexion/extension, ankle dorsiflexion/plantarflexion and EHL. No clonus noted bilaterally. No pain upon palpation of greater trochanters. No pain with internal/external rotation of bilateral hips. Sensation intact bilaterally. Negative slump test bilaterally. Ambulates without aid, gait steady.     Skin:    General: Skin is warm and dry.     Capillary Refill: Capillary refill takes less than 2 seconds.  Neurological:     General: No focal deficit present.     Mental Status: He is alert and oriented to person, place, and time.  Psychiatric:        Mood and Affect: Mood normal.        Behavior: Behavior normal.     Ortho Exam  Imaging: No results found.  Past Medical/Family/Surgical/Social History: Medications & Allergies  reviewed per EMR, new medications updated. Patient Active Problem List   Diagnosis Date Noted   Prediabetes 11/30/2014   Lower urinary tract symptoms (LUTS) 11/30/2014   Borderline abnormal TFTs 02/16/2014   Chronic dermatitis 12/02/2012   Annual physical exam 07/03/2011   Essential hypertension 03/15/2009   Past Medical History:  Diagnosis Date   Chronic dermatitis 12/02/2012   Elbow fracture, right    no surgery   GERD (gastroesophageal reflux disease)    HYPERTENSION, BORDERLINE 03/15/2009   Lower urinary tract symptoms (LUTS) 11/30/2014   Prediabetes 11/30/2014   UTI (urinary tract infection)    UTI 2008, saw urology; patient reports a cystoscopy (told ok)   Family History  Problem Relation Age of Onset   Breast cancer Mother    Hypertension Mother    Diabetes Mother        M, Brother    Colon polyps Mother    Stroke Brother    Prostate cancer Other        Uncle   CAD Neg Hx    Esophageal cancer Neg Hx    Rectal cancer  Neg Hx    Stomach cancer Neg Hx    Colon cancer Neg Hx    Past Surgical History:  Procedure Laterality Date   COLONOSCOPY  07/14/2013   Christella Hartigan   KNEE ARTHROSCOPY W/ MENISCAL REPAIR  remotely   Right   POLYPECTOMY     Social History   Occupational History   Occupation: deliver , truck driver  Tobacco Use   Smoking status: Former    Current packs/day: 0.00    Types: Cigarettes    Quit date: 11/12/2006    Years since quitting: 17.2   Smokeless tobacco: Never  Vaping Use   Vaping status: Never Used  Substance and Sexual Activity   Alcohol use: No   Drug use: No   Sexual activity: Not on file
